# Patient Record
Sex: Male | Born: 1945 | Race: White | Hispanic: No | Marital: Married | Smoking: Never smoker
Health system: Southern US, Community
[De-identification: ages and names within clinical notes are randomized; demographics above are authoritative.]

## PROBLEM LIST (undated history)

## (undated) DIAGNOSIS — M109 Gout, unspecified: Secondary | ICD-10-CM

## (undated) DIAGNOSIS — I10 Essential (primary) hypertension: Secondary | ICD-10-CM

## (undated) DIAGNOSIS — R42 Dizziness and giddiness: Secondary | ICD-10-CM

## (undated) DIAGNOSIS — G4730 Sleep apnea, unspecified: Secondary | ICD-10-CM

## (undated) DIAGNOSIS — N400 Enlarged prostate without lower urinary tract symptoms: Secondary | ICD-10-CM

## (undated) DIAGNOSIS — K219 Gastro-esophageal reflux disease without esophagitis: Secondary | ICD-10-CM

## (undated) HISTORY — DX: Essential (primary) hypertension: I10

## (undated) HISTORY — PX: APPENDECTOMY: SHX54

## (undated) HISTORY — DX: Gout, unspecified: M10.9

## (undated) HISTORY — DX: Benign prostatic hyperplasia without lower urinary tract symptoms: N40.0

---

## 2000-09-03 ENCOUNTER — Encounter: Payer: Self-pay | Admitting: Family Medicine

## 2002-09-04 ENCOUNTER — Encounter: Payer: Self-pay | Admitting: Family Medicine

## 2002-09-04 LAB — CONVERTED CEMR LAB: PSA: 0.2 ng/mL

## 2003-10-04 ENCOUNTER — Encounter: Payer: Self-pay | Admitting: Family Medicine

## 2003-10-04 LAB — CONVERTED CEMR LAB: PSA: 0.4 ng/mL

## 2003-12-29 ENCOUNTER — Ambulatory Visit (HOSPITAL_COMMUNITY): Admission: RE | Admit: 2003-12-29 | Discharge: 2003-12-29 | Payer: Self-pay | Admitting: *Deleted

## 2004-02-17 ENCOUNTER — Ambulatory Visit: Payer: Self-pay | Admitting: Family Medicine

## 2004-04-09 ENCOUNTER — Ambulatory Visit: Payer: Self-pay | Admitting: Family Medicine

## 2005-02-18 ENCOUNTER — Ambulatory Visit: Payer: Self-pay | Admitting: Family Medicine

## 2005-03-04 ENCOUNTER — Ambulatory Visit: Payer: Self-pay | Admitting: Family Medicine

## 2005-04-01 ENCOUNTER — Ambulatory Visit: Payer: Self-pay | Admitting: Family Medicine

## 2005-09-07 ENCOUNTER — Ambulatory Visit: Payer: Self-pay | Admitting: Family Medicine

## 2005-10-15 ENCOUNTER — Ambulatory Visit (HOSPITAL_COMMUNITY): Admission: RE | Admit: 2005-10-15 | Discharge: 2005-10-15 | Payer: Self-pay | Admitting: Specialist

## 2005-12-23 ENCOUNTER — Ambulatory Visit: Payer: Self-pay | Admitting: Family Medicine

## 2005-12-31 ENCOUNTER — Ambulatory Visit: Payer: Self-pay | Admitting: Family Medicine

## 2006-01-07 ENCOUNTER — Ambulatory Visit: Payer: Self-pay | Admitting: Family Medicine

## 2006-01-17 ENCOUNTER — Ambulatory Visit: Payer: Self-pay | Admitting: Family Medicine

## 2006-03-07 ENCOUNTER — Ambulatory Visit: Payer: Self-pay | Admitting: Family Medicine

## 2006-03-07 LAB — CONVERTED CEMR LAB: PSA: 0.41 ng/mL

## 2006-03-10 ENCOUNTER — Ambulatory Visit: Payer: Self-pay | Admitting: Family Medicine

## 2006-03-31 ENCOUNTER — Ambulatory Visit: Payer: Self-pay | Admitting: Family Medicine

## 2006-04-07 ENCOUNTER — Ambulatory Visit: Payer: Self-pay | Admitting: Family Medicine

## 2006-05-09 ENCOUNTER — Encounter (INDEPENDENT_AMBULATORY_CARE_PROVIDER_SITE_OTHER): Payer: Self-pay | Admitting: *Deleted

## 2006-05-09 ENCOUNTER — Ambulatory Visit (HOSPITAL_COMMUNITY): Admission: RE | Admit: 2006-05-09 | Discharge: 2006-05-09 | Payer: Self-pay | Admitting: General Surgery

## 2007-01-02 HISTORY — PX: OTHER SURGICAL HISTORY: SHX169

## 2007-02-10 ENCOUNTER — Ambulatory Visit: Payer: Self-pay | Admitting: Family Medicine

## 2007-03-09 ENCOUNTER — Encounter: Payer: Self-pay | Admitting: Family Medicine

## 2007-03-09 DIAGNOSIS — N400 Enlarged prostate without lower urinary tract symptoms: Secondary | ICD-10-CM | POA: Insufficient documentation

## 2007-03-09 DIAGNOSIS — L309 Dermatitis, unspecified: Secondary | ICD-10-CM | POA: Insufficient documentation

## 2007-03-09 DIAGNOSIS — L259 Unspecified contact dermatitis, unspecified cause: Secondary | ICD-10-CM

## 2007-03-13 ENCOUNTER — Ambulatory Visit: Payer: Self-pay | Admitting: Family Medicine

## 2007-03-13 LAB — CONVERTED CEMR LAB
ALT: 25 units/L (ref 0–53)
AST: 32 units/L (ref 0–37)
Alkaline Phosphatase: 45 units/L (ref 39–117)
CO2: 29 meq/L (ref 19–32)
Calcium: 9.2 mg/dL (ref 8.4–10.5)
Chloride: 102 meq/L (ref 96–112)
Creatinine, Ser: 1.1 mg/dL (ref 0.4–1.5)
Glucose, Bld: 95 mg/dL (ref 70–99)
PSA: 0.37 ng/mL (ref 0.10–4.00)
TSH: 1.33 microintl units/mL (ref 0.35–5.50)
Total CHOL/HDL Ratio: 4.4
VLDL: 16 mg/dL (ref 0–40)

## 2007-03-15 ENCOUNTER — Ambulatory Visit: Payer: Self-pay | Admitting: Family Medicine

## 2007-03-23 ENCOUNTER — Ambulatory Visit: Payer: Self-pay | Admitting: Family Medicine

## 2007-03-24 ENCOUNTER — Ambulatory Visit: Payer: Self-pay | Admitting: Family Medicine

## 2007-03-27 ENCOUNTER — Encounter (INDEPENDENT_AMBULATORY_CARE_PROVIDER_SITE_OTHER): Payer: Self-pay | Admitting: *Deleted

## 2007-05-01 ENCOUNTER — Telehealth: Payer: Self-pay | Admitting: Family Medicine

## 2007-05-04 ENCOUNTER — Ambulatory Visit: Payer: Self-pay | Admitting: Family Medicine

## 2007-05-29 ENCOUNTER — Ambulatory Visit: Payer: Self-pay | Admitting: Family Medicine

## 2007-06-23 ENCOUNTER — Ambulatory Visit: Payer: Self-pay | Admitting: Family Medicine

## 2007-06-29 ENCOUNTER — Encounter: Payer: Self-pay | Admitting: Family Medicine

## 2007-07-13 ENCOUNTER — Encounter: Payer: Self-pay | Admitting: Family Medicine

## 2007-07-18 ENCOUNTER — Telehealth: Payer: Self-pay | Admitting: Family Medicine

## 2007-11-21 ENCOUNTER — Ambulatory Visit (HOSPITAL_COMMUNITY): Admission: RE | Admit: 2007-11-21 | Discharge: 2007-11-21 | Payer: Self-pay | Admitting: Specialist

## 2008-01-03 ENCOUNTER — Ambulatory Visit: Payer: Self-pay | Admitting: Family Medicine

## 2008-03-18 ENCOUNTER — Ambulatory Visit: Payer: Self-pay | Admitting: Family Medicine

## 2008-03-18 LAB — CONVERTED CEMR LAB
ALT: 18 units/L (ref 0–53)
BUN: 15 mg/dL (ref 6–23)
Basophils Absolute: 0 10*3/uL (ref 0.0–0.1)
Basophils Relative: 0.7 % (ref 0.0–3.0)
Bilirubin, Direct: 0.1 mg/dL (ref 0.0–0.3)
Chloride: 107 meq/L (ref 96–112)
Cholesterol: 190 mg/dL (ref 0–200)
Eosinophils Absolute: 0.2 10*3/uL (ref 0.0–0.7)
GFR calc Af Amer: 87 mL/min
GFR calc non Af Amer: 72 mL/min
Glucose, Bld: 100 mg/dL — ABNORMAL HIGH (ref 70–99)
Hemoglobin: 15.3 g/dL (ref 13.0–17.0)
Neutrophils Relative %: 55.4 % (ref 43.0–77.0)
Potassium: 4.4 meq/L (ref 3.5–5.1)
RBC: 4.78 M/uL (ref 4.22–5.81)
RDW: 11.5 % (ref 11.5–14.6)
Sodium: 142 meq/L (ref 135–145)
Total Bilirubin: 0.8 mg/dL (ref 0.3–1.2)
Total Protein: 6.6 g/dL (ref 6.0–8.3)

## 2008-03-21 ENCOUNTER — Ambulatory Visit: Payer: Self-pay | Admitting: Family Medicine

## 2008-03-28 ENCOUNTER — Ambulatory Visit: Payer: Self-pay | Admitting: Family Medicine

## 2008-03-28 ENCOUNTER — Encounter (INDEPENDENT_AMBULATORY_CARE_PROVIDER_SITE_OTHER): Payer: Self-pay | Admitting: *Deleted

## 2008-03-28 LAB — CONVERTED CEMR LAB
OCCULT 1: NEGATIVE
OCCULT 2: NEGATIVE

## 2008-05-29 ENCOUNTER — Ambulatory Visit: Payer: Self-pay | Admitting: Family Medicine

## 2008-08-26 ENCOUNTER — Ambulatory Visit: Payer: Self-pay | Admitting: Family Medicine

## 2009-01-14 ENCOUNTER — Ambulatory Visit: Payer: Self-pay | Admitting: Family Medicine

## 2009-03-06 ENCOUNTER — Ambulatory Visit: Payer: Self-pay | Admitting: Family Medicine

## 2009-03-06 ENCOUNTER — Telehealth: Payer: Self-pay | Admitting: Family Medicine

## 2009-03-20 ENCOUNTER — Ambulatory Visit: Payer: Self-pay | Admitting: Family Medicine

## 2009-03-20 LAB — CONVERTED CEMR LAB
AST: 16 units/L (ref 0–37)
Alkaline Phosphatase: 43 units/L (ref 39–117)
Basophils Absolute: 0 10*3/uL (ref 0.0–0.1)
CO2: 27 meq/L (ref 19–32)
Calcium: 9.5 mg/dL (ref 8.4–10.5)
Chloride: 104 meq/L (ref 96–112)
Creatinine, Ser: 1.15 mg/dL (ref 0.40–1.50)
Glucose, Bld: 86 mg/dL (ref 70–99)
Lymphocytes Relative: 27 % (ref 12–46)
Lymphs Abs: 1.9 10*3/uL (ref 0.7–4.0)
MCHC: 34.5 g/dL (ref 30.0–36.0)
Neutro Abs: 3.7 10*3/uL (ref 1.7–7.7)
Neutrophils Relative %: 54 % (ref 43–77)
Potassium: 4.4 meq/L (ref 3.5–5.3)
Total Bilirubin: 0.5 mg/dL (ref 0.3–1.2)
WBC: 6.9 10*3/uL (ref 4.0–10.5)

## 2009-03-25 ENCOUNTER — Ambulatory Visit: Payer: Self-pay | Admitting: Family Medicine

## 2009-04-02 ENCOUNTER — Ambulatory Visit: Payer: Self-pay | Admitting: Family Medicine

## 2009-04-02 LAB — CONVERTED CEMR LAB
OCCULT 1: NEGATIVE
OCCULT 2: NEGATIVE
OCCULT 3: NEGATIVE

## 2009-04-02 LAB — FECAL OCCULT BLOOD, GUAIAC: Fecal Occult Blood: NEGATIVE

## 2009-04-03 ENCOUNTER — Encounter: Payer: Self-pay | Admitting: Family Medicine

## 2009-05-06 HISTORY — PX: OTHER SURGICAL HISTORY: SHX169

## 2009-07-01 ENCOUNTER — Encounter: Payer: Self-pay | Admitting: Unknown Physician Specialty

## 2009-07-04 ENCOUNTER — Encounter: Payer: Self-pay | Admitting: Unknown Physician Specialty

## 2009-08-03 ENCOUNTER — Encounter: Payer: Self-pay | Admitting: Unknown Physician Specialty

## 2009-11-11 ENCOUNTER — Encounter (INDEPENDENT_AMBULATORY_CARE_PROVIDER_SITE_OTHER): Payer: Self-pay | Admitting: *Deleted

## 2010-01-15 ENCOUNTER — Ambulatory Visit: Payer: Self-pay | Admitting: Family Medicine

## 2010-02-06 ENCOUNTER — Telehealth: Payer: Self-pay | Admitting: Family Medicine

## 2010-03-23 ENCOUNTER — Ambulatory Visit: Payer: Self-pay | Admitting: Family Medicine

## 2010-03-24 LAB — CONVERTED CEMR LAB
ALT: 19 units/L (ref 0–53)
Alkaline Phosphatase: 52 units/L (ref 39–117)
Basophils Absolute: 0 10*3/uL (ref 0.0–0.1)
Bilirubin, Direct: 0.1 mg/dL (ref 0.0–0.3)
CO2: 29 meq/L (ref 19–32)
Calcium: 9.3 mg/dL (ref 8.4–10.5)
Chloride: 106 meq/L (ref 96–112)
Creatinine, Ser: 1.1 mg/dL (ref 0.4–1.5)
Direct LDL: 142.6 mg/dL
HCT: 43.2 % (ref 39.0–52.0)
HDL: 42.2 mg/dL (ref >39.00)
Hemoglobin: 14.7 g/dL (ref 13.0–17.0)
Lymphocytes Relative: 19.2 % (ref 12.0–46.0)
MCV: 93.3 fL (ref 78.0–100.0)
Monocytes Absolute: 0.9 10*3/uL (ref 0.1–1.0)
Neutrophils Relative %: 66 % (ref 43.0–77.0)
PSA: 0.4 ng/mL (ref 0.10–4.00)
Potassium: 4.2 meq/L (ref 3.5–5.1)
RDW: 12.6 % (ref 11.5–14.6)
Total Bilirubin: 1.1 mg/dL (ref 0.3–1.2)
Total Protein: 6.5 g/dL (ref 6.0–8.3)
VLDL: 15 mg/dL (ref 0.0–40.0)

## 2010-03-26 ENCOUNTER — Ambulatory Visit: Payer: Self-pay | Admitting: Family Medicine

## 2010-03-26 DIAGNOSIS — R42 Dizziness and giddiness: Secondary | ICD-10-CM | POA: Insufficient documentation

## 2010-05-06 NOTE — Letter (Signed)
Summary: Nadara Eaton letter  [place] [facility]  [address] [place], [place] [address]   Phone: [phone]  Fax: [phone]       11/11/2009 MRN: [id number]  [facility] [address] [place], [place]  [address]  Dear Mr. [facility] [place], and [facility] announce the retirement of Arta Silence, M.D., from full-time practice at the [facility] office effective October 02, 2009 and his plans of returning part-time.  It is important to Dr. Hetty Ely and to our practice that you understand that [facility] has seven physicians in our office for your health care needs.  We will continue to offer the same exceptional care that you have today.    Dr. Hetty Ely has spoken to many of you about his plans for retirement and returning part-time in the fall.   We will continue to work with you through the transition to schedule appointments for you in the office and meet the high standards that [place] is committed to.   Again, it is with great pleasure that we share the news that Dr. Hetty Ely will return to [facility] in October of 2011 with a reduced schedule.    If you have any questions, or would like to request an appointment with one of our physicians, please call us at [phone] and press the option for Scheduling an appointment.  We take pleasure in providing you with excellent patient care and look forward to seeing you at your next office visit.  Our [facility] Physicians are:  Tillman Abide, M.D. Laurita Quint, M.D. Roxy Manns, M.D. Kerby Nora, M.D. Hannah Beat, M.D. Ruthe Mannan, M.D. We proudly welcomed Raechel Ache, M.D. and Eustaquio Boyden, M.D. to the practice in July/August 2011.  Sincerely,  [facility]

## 2010-05-06 NOTE — Progress Notes (Signed)
Summary: Halog Cream  Phone Note Refill Request Message from:  Scriptline on February 06, 2010 8:15 AM  Refills Requested: Medication #1:  HALOG 0.1 %  CREA Apply As needed. [facility]  [id number]   Last Fill Date:  No date sent   Pharmacy Phone:  [phone]   Method Requested: Electronic Initial call taken by: Delilah Shan CMA Duncan Dull),  February 06, 2010 8:15 AM  Follow-up for Phone Call        please send in 60g with 1 rf.  Follow-up by: Crawford Givens MD,  February 06, 2010 8:23 AM  Additional Follow-up for Phone Call Additional follow up Details #1::        Done Additional Follow-up by: Delilah Shan CMA (AAMA),  February 06, 2010 8:27 AM    New/Updated Medications: HALOG 0.1 %  CREA (HALCINONIDE) Apply As needed two times a day, notify MD lack of response. Prescriptions: HALOG 0.1 %  CREA (HALCINONIDE) Apply As needed two times a day, notify MD lack of response.  #60g x 1   Entered by:   Delilah Shan CMA (AAMA)   Authorized by:   Crawford Givens MD   Signed by:   Delilah Shan CMA (AAMA) on 02/06/2010   Method used:   [facility]ronically to        [facility]  [id number] (retail[facility]       [address]       [place], [place]  [address]       Ph: [phone] or [phone]       Fax: [phone]   RxID:   [id number] HALOG 0.1 %  CREA (HALCINONIDE) Apply As needed two times a day, notify MD lack of response.  #60g x 1   Entered and Authorized by:   Crawford Givens MD   Signed by:   Crawford Givens MD on 02/06/2010   Method used:   Historical   RxID:   [id number]

## 2010-05-06 NOTE — Assessment & Plan Note (Signed)
Summary: FLU SHOT/CLE  Nurse Visit   Allergies: No Known Drug Allergies  Immunizations Administered:  Influenza Vaccine # 1:    Vaccine Type: Fluvax 3+    Site: left deltoid    Mfr: GlaxoSmithKline    Dose: 0.5 ml    Route: IM    Given by: Mervin Hack CMA (AAMA)    Exp. Date: 10/03/2010    Lot #: [id number]    VIS given: 10/28/09 version given January 15, 2010.  Flu Vaccine Consent Questions:    Do you have a history of severe allergic reactions to this vaccine? no    Any prior history of allergic reactions to egg and/or gelatin? no    Do you have a sensitivity to the preservative Thimersol? no    Do you have a past history of Guillan-Barre Syndrome? no    Do you currently have an acute febrile illness? no    Have you ever had a severe reaction to latex? no    Vaccine information given and explained to patient? yes  Orders Added: 1)  Flu Vaccine 48yrs + [90658] 2)  Admin 1st Vaccine [09811]

## 2010-05-07 NOTE — Assessment & Plan Note (Signed)
Summary: CPX/CLE   Vital Signs:  Patient profile:   65 year old male Weight:      216.50 pounds BMI:     30.30 Temp:     98.2 degrees F oral Pulse rate:   64 / minute Pulse rhythm:   regular BP sitting:   128 / 84  (left arm) Cuff size:   large  Vitals Entered By: Sydell Axon LPN (01-Apr-2010 10:49 AM) CC: 30 Minute checkup   History of Present Illness: Pt here for Comp Exam. He hasn't been here in one year and feels well excepyt for vertigo which appears affected by pressure changes in the atmosphere and eating seems to help his dizziness also. He has seen ENT and was told he had some hearing loss in the right ear. Ruled out  He saw PT and that helped a lot.Marland KitchenMarland KitchenMarland KitchenFeb/Mar time frame. He continues. His sister also has vertigo of BPPD but comes and goes.   Preventive Screening-Counseling & Management  Alcohol-Tobacco     Alcohol drinks/day: 0     Smoking Status: never     Passive Smoke Exposure: no  Caffeine-Diet-Exercise     Caffeine use/day: 4     Does Patient Exercise: yes     Type of exercise: gym, dances 3-5 times a week.     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  (ICD-692.9) 9)  Benign Prostatic Hypertrophy  (ICD-600.00)  Medications Prior to Update: 1)  Halog 0.1 %  Crea (Halcinonide) .... Apply As Needed Two Times A Day, Notify Md Lack of Response.  Current Medications (verified): 1)  Halog 0.1 %  Crea (Halcinonide) .... Apply As Needed Two Times A Day, Notify Md Lack of Response.  Allergies: No Known Drug Allergies  Past History:  Past Medical History: Last updated: 03/09/2007 Benign prostatic hypertrophy  Family  History: Last updated: 2010-04-01 Father: Died at age 9 Sep 13, 2022), Prostate cancer (99), lung cancer, pneumonia Mother: Died at age 20, CHF, died during Cath secondary MI, overwhelming hypothyroidism Brother A 9 Sister A  66  HTN  BPPV CV + Mother MI HBP ? 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 + Mother DM - none Prostate cancer + Father Depression  - none ETOH/Drug Abuse - none Stroke + MGF  Review of Systems General:  Denies chills, fatigue, fever, sweats, weakness, and weight  loss. Eyes:  Denies blurring, discharge, and eye pain. ENT:  Complains of decreased hearing; denies ear discharge and ringing in ears; right. CV:  Denies chest pain or discomfort, fainting, fatigue, palpitations, shortness of breath with exertion, swelling of feet, and swelling of hands. Resp:  Denies cough, shortness of breath, and wheezing. GI:  Complains of indigestion; denies abdominal pain, bloody stools, change in bowel habits, constipation, dark tarry stools, diarrhea, loss of appetite, nausea, vomiting, vomiting blood, and yellowish skin color; mild. GU:  Denies discharge,  nocturia, and urinary frequency. MS:  Complains of joint pain and stiffness; denies low back pain, muscle aches, and muscle weakness; knees and shoulder. Derm:  Complains of dryness, itching, and rash; has known eczema.. Neuro:  Denies numbness, poor balance, tingling, and tremors.  Physical Exam  General:  Well-developed,well-nourished,in no acute distress; alert,appropriate and cooperative throughout examination Head:  Normocephalic and atraumatic without obvious abnormalities. No apparent alopecia or balding. Sinuses NT. Eyes:  Conjunctiva clear bilaterally. No nystagmus noted. Ears:  External ear exam shows no significant lesions or deformities.  Otoscopic examination reveals clear canals, tympanic membranes are intact bilaterally without bulging, retraction, inflammation or discharge. Hearing is grossly normal bilaterally. Nose:  External nasal examination shows no deformity or inflammation. Nasal mucosa are pink and moist without lesions or exudates. Mouth:  Oral mucosa and oropharynx without lesions or exudates.  Teeth in good repair. Neck:  supple with full rom and no masses or thyromegally, no JVD or carotid bruit  Chest Wall:  No deformities, masses, tenderness or gynecomastia noted. Breasts:  No masses or gynecomastia noted Lungs:  Normal respiratory effort, chest expands symmetrically. Lungs are clear to auscultation, no crackles or wheezes. Heart:  Normal rate and regular rhythm. S1 and S2 normal without gallop, murmur, click, rub or other extra sounds. Abdomen:  Bowel sounds positive,abdomen soft and non-tender without masses, organomegaly or hernias noted. Rectal:  No external abnormalities noted. Normal sphincter tone. No rectal masses or tenderness. G neg, mild scar of fistula repair int eh canlo at the 10 oclock posistion and decompr hemms. Genitalia:  Testes bilaterally descended without nodularity, tenderness or masses. No scrotal masses or lesions. No penis lesions or  urethral discharge. Prostate:  Prostate gland firm and smooth, no enlargement, nodularity, tenderness, mass, asymmetry or induration. 20-30-gms. Msk:  No deformity or scoliosis noted of thoracic or lumbar spine.   Pulses:  R and L carotid,radial,femoral,dorsalis pedis and posterior tibial pulses are full and equal bilaterally Extremities:  No clubbing, cyanosis, edema, or deformity noted with normal full range of motion of all joints.   Neurologic:  No cranial nerve deficits noted. Station and gait are normal. Sensory, motor and coordinative functions appear intact. Movement quickly causes mild dysequilibriun but not spinning. Skin:  Intact without suspicious lesions or rashes Cervical Nodes:  No lymphadenopathy noted Inguinal Nodes:  No significant adenopathy Psych:  Cognition and judgment appear intact. Alert and cooperative with normal attention span and concentration. No apparent delusions, illusions, hallucinations   Impression & Recommendations:  Problem # 1:  HEALTH MAINTENANCE EXAM (ICD-V70.0) Assessment Comment Only  Reviewed preventive care protocols, scheduled due services, and updated immunizations.  Problem # 2:  ECZEMA (ICD-692.9) Assessment: Unchanged  Stable. His updated medication list for this problem includes:    Halog 0.1 % Crea (Halcinonide) .Marland Kitchen... Apply as needed two times a day, notify md lack of response.  Discussed avoidance of triggers and symptomatic treatment.   Problem # 3:  HYPERCHOLESTEROLEMIA, 177/LDL 117/HDL 35.0 (ICD-272.0)  Assessment: Unchanged LDL slightly high...discussed meds which he wants to avoid and diet. Labs Reviewed: SGOT: 27 (03/23/2010)   SGPT: 19 (03/23/2010)   HDL:42.20 (03/23/2010), 53 (03/20/2009)  LDL:120 (03/20/2009), 132 (03/18/2008)  Chol:205 (03/23/2010), 191 (03/20/2009)  Trig:75.0 (03/23/2010), 92 (03/20/2009)  Problem # 4:  BENIGN PROSTATIC HYPERTROPHY (ICD-600.00) Assessment: Unchanged Stable.  Problem # 5:  DIZZINESS,  CHRONIC (ICD-780.4) Assessment: Unchanged  Hads learned to live with this. Has seen ENT, has had PT and has done extra exercises on his own. He is very active, competitively dances and works out regularly. Seems controlled.  Complete Medication List: 1)  Halog 0.1 % Crea (Halcinonide) .... Apply as needed two times a day, notify md lack of response.  Patient Instructions: 1)  RTC one year, sooner as needed.   Orders Added: 1)  Est. Patient 40-64 years [99396]    Current Allergies (reviewed today): No known allergies

## 2010-08-12 ENCOUNTER — Telehealth: Payer: Self-pay | Admitting: *Deleted

## 2010-08-12 NOTE — Telephone Encounter (Signed)
I would suggest a pneumonia vaccine the next time he is in here. His reminding me would be appreciated. Stool card testing should be done yearly. My mistake but honestly believe he can wait until next visit.

## 2010-08-12 NOTE — Telephone Encounter (Signed)
Pt received a reminder letter from [facility] that he has not had a pneumonia vaccine and no recent colon cancer check.  He says he didn't get stool cards at his visit last December.  Please advise if these are things that he needs.  He says if these arent necessary he doesn't want them.

## 2010-08-13 NOTE — Telephone Encounter (Signed)
Patient notified as instructed by telephone. 

## 2010-08-21 NOTE — Op Note (Signed)
Alan Castillo, Alan Castillo              ACCOUNT NO.:  [id number]   MEDICAL RECORD NO.:  [id number]          PATIENT TYPE:  AMB   LOCATION:  DAY                          FACILITY:  [facility]   PHYSICIAN:  Timothy E. Earlene Plater, M.D. DATE OF BIRTH:  1945-04-25   DATE OF PROCEDURE:  05/09/2006  DATE OF DISCHARGE:                               OPERATIVE REPORT   PREOPERATIVE DIAGNOSIS:  Fistula in ano.   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   OPERATIVE PROCEDURE:  Examination under anesthesia.  Polypectomy,  flexible sigmoidoscopy and fistulotomy with application of seton.   SURGEON:  Timothy E. Earlene Plater, M.D.   ANESTHESIA:  General.   Mr. Molinelli is 7, otherwise healthy, was in the office in the last 6  weeks, had an incision and drainage of rectal abscess.  That has settled  down.  He has been on antibiotics and he presented couple of weeks ago  with a defined fistula in ano.  This was explained to him in as much  detail was possible including the particulars of the sphincter muscles.  He is now prepared for this procedure.  He has a history of rectal  bleeding, rectal pain.   The patient is seen, identified the permit signed.   The patient taken to the operating room, placed supine.  LMA anesthesia  provided.  He was placed in lithotomy.  The right posterior fistula in  ano was noted externally.  By digital exam, there was stricture and the  fistula could be palpated.  I passed the flexible sigmoidoscopy beyond  this for a distance of 50 cm.  There was some solid and liquid stool but  as best could be seen and irrigated, there was no disease process.  Scope withdrawn and removed.  Then using the operating anoscope after  the anus area was prepared and draped in the usual fashion.  A malleable  fistula probe was passed from the external opening to the internal  opening.  The external opening was exactly 2 cm to the right of the  posterior midline,  10 cm from the anal orifice.  The internal opening  was 3 cm inside the anal verge directly posterior.  There was a rectal  polyp that was removed.  There was a bridging redundant rectal mucosa  over the fistula that was removed and then the internal fistulous  opening was debrided and excised.  I removed a malleable probe and then  using bone curettes gently debrided the entire fistula tract.  I also  passed heavy silk suture and further debrided the tract.  I then passed  a vein loop through the fistulous tract brought it to the outside and  tied it snugly externally.  I partially closed the internal opening by  applying 2-0 Vicryl sutures.  When this procedure was  complete.  Gelfoam gauze and dry sterile dressing applied.  He tolerated  it well.  Counts correct and he was removed to recovery in good  condition.   Written and verbal instructions given him and his wife including  Percocet #36 and  Cipro 500 b.i.d.      Timothy E. Earlene Plater, M.D.  Electronically Signed     TED/MEDQ  D:  05/09/2006  T:  05/09/2006  Job:  [id number]

## 2010-08-21 NOTE — Letter (Signed)
January 19, 2006     Gita Kudo, M.D.  [address], [address]  [place] [address]   RE:  OPIE, MACLAUGHLIN  MRN:  [id number]  /  DOB:  [birth year]   Dear Dr. Maryagnes Amos:   This is to introduce Alan Castillo, a 65 year old white male who has an  abscess on his gluteal area.  I saw him initially September 20 with an area  of what I thought was an abscess versus a bruise, but thought it was more  likely an abscess and started him Keflex 500 four times a day.  I saw him  back on the 28th prior to my leaving on vacation and he was improving.  It  was felt to be a little bit smaller.  No obvious pointing or fluctuance.  I  extended his Keflex until I was going to be back from vacation to see him  then.  He cam in the following week to see one of my partners with worsening  symptoms.  She suspected possible MRSA, put him on Bactrim DS two b.i.d.,  but did not send off a culture.  I saw him on the 15th and things looked to  be improved from his perspective.  However, he has three sites of  spontaneous eruption and is draining purulent material which I sent for  culture, and I just have to wonder - he feels better but I am not sure  whether he may need I&D, and with the extent of what he has at this stage I  thought he ought to be seen by a surgeon.   His past medical history otherwise is significant for elevated cholesterol,  BPH, eczema, and atopic dermatitis.  He is a real healthy guy who is on no  medicines other than his antibiotic at the present time.  He has no known  drug allergies and has no other real problems.   I appreciate your seeing him and look forward to your evaluation.    Sincerely,     ______________________________  Arta Silence, MD    RNS/MedQ  /  Job #:  [id number]  DD:  01/19/2006 / DT:  01/20/2006

## 2011-01-21 ENCOUNTER — Ambulatory Visit (INDEPENDENT_AMBULATORY_CARE_PROVIDER_SITE_OTHER): Payer: BC Managed Care – PPO

## 2011-01-21 DIAGNOSIS — Z23 Encounter for immunization: Secondary | ICD-10-CM

## 2011-03-22 ENCOUNTER — Other Ambulatory Visit: Payer: Self-pay | Admitting: Family Medicine

## 2011-03-22 DIAGNOSIS — R42 Dizziness and giddiness: Secondary | ICD-10-CM

## 2011-03-22 DIAGNOSIS — N400 Enlarged prostate without lower urinary tract symptoms: Secondary | ICD-10-CM

## 2011-03-22 DIAGNOSIS — E7800 Pure hypercholesterolemia, unspecified: Secondary | ICD-10-CM | POA: Insufficient documentation

## 2011-03-24 ENCOUNTER — Encounter: Payer: Self-pay | Admitting: Family Medicine

## 2011-03-24 ENCOUNTER — Other Ambulatory Visit (INDEPENDENT_AMBULATORY_CARE_PROVIDER_SITE_OTHER): Payer: MEDICARE

## 2011-03-24 DIAGNOSIS — R42 Dizziness and giddiness: Secondary | ICD-10-CM

## 2011-03-24 DIAGNOSIS — E7800 Pure hypercholesterolemia, unspecified: Secondary | ICD-10-CM

## 2011-03-24 LAB — LIPID PANEL
Cholesterol: 182 mg/dL (ref 0–200)
LDL Cholesterol: 117 mg/dL — ABNORMAL HIGH (ref 0–99)
VLDL: 18.2 mg/dL (ref 0.0–40.0)

## 2011-03-24 LAB — HEPATIC FUNCTION PANEL
AST: 26 U/L (ref 0–37)
Albumin: 4.3 g/dL (ref 3.5–5.2)
Alkaline Phosphatase: 56 U/L (ref 39–117)

## 2011-03-24 LAB — CBC WITH DIFFERENTIAL/PLATELET
Basophils Relative: 0.7 % (ref 0.0–3.0)
Lymphocytes Relative: 29.8 % (ref 12.0–46.0)
MCHC: 33.7 g/dL (ref 30.0–36.0)
Neutro Abs: 3.2 10*3/uL (ref 1.4–7.7)
Neutrophils Relative %: 53.7 % (ref 43.0–77.0)
Platelets: 228 10*3/uL (ref 150.0–400.0)
RBC: 4.99 Mil/uL (ref 4.22–5.81)
RDW: 12.2 % (ref 11.5–14.6)
WBC: 5.9 10*3/uL (ref 4.5–10.5)

## 2011-03-24 LAB — RENAL FUNCTION PANEL
Albumin: 4.3 g/dL (ref 3.5–5.2)
BUN: 15 mg/dL (ref 6–23)
CO2: 29 mEq/L (ref 19–32)
Creatinine, Ser: 1.1 mg/dL (ref 0.4–1.5)
GFR: 69.82 mL/min (ref >60.00)
Glucose, Bld: 94 mg/dL (ref 70–99)

## 2011-03-24 LAB — TSH: TSH: 1.59 u[IU]/mL (ref 0.35–5.50)

## 2011-03-25 ENCOUNTER — Other Ambulatory Visit: Payer: Self-pay

## 2011-04-01 ENCOUNTER — Ambulatory Visit (INDEPENDENT_AMBULATORY_CARE_PROVIDER_SITE_OTHER): Payer: Medicare Other | Admitting: Family Medicine

## 2011-04-01 ENCOUNTER — Encounter: Payer: Self-pay | Admitting: Family Medicine

## 2011-04-01 DIAGNOSIS — M25569 Pain in unspecified knee: Secondary | ICD-10-CM

## 2011-04-01 DIAGNOSIS — L259 Unspecified contact dermatitis, unspecified cause: Secondary | ICD-10-CM

## 2011-04-01 DIAGNOSIS — M25561 Pain in right knee: Secondary | ICD-10-CM | POA: Insufficient documentation

## 2011-04-01 DIAGNOSIS — E7800 Pure hypercholesterolemia, unspecified: Secondary | ICD-10-CM

## 2011-04-01 DIAGNOSIS — N400 Enlarged prostate without lower urinary tract symptoms: Secondary | ICD-10-CM

## 2011-04-01 DIAGNOSIS — R42 Dizziness and giddiness: Secondary | ICD-10-CM

## 2011-04-01 DIAGNOSIS — Z23 Encounter for immunization: Secondary | ICD-10-CM

## 2011-04-01 MED ORDER — HYDROCORTISONE VALERATE 0.2 % EX CREA: TOPICAL_CREAM | Freq: Two times a day (BID) | CUTANEOUS | Status: AC

## 2011-04-01 NOTE — Progress Notes (Signed)
Addended by: Sydell Axon C on: 04/01/2011 11:00 AM   Modules accepted: Orders

## 2011-04-01 NOTE — Assessment & Plan Note (Signed)
Tolerates well. His ballroom dancing actually improves this.

## 2011-04-01 NOTE — Progress Notes (Signed)
  Subjective:    Patient ID: Alan Castillo, male    DOB: 02/22/1946, 65 y.o.   MRN: [id number]  HPI Pt here for Comp Exam. He has his own insurance as he is still working full time and does not yet use Medicare. His rectum has finally seemed to heal. He has vertigo regularly and some mild hearing loss, nonprogressive.  He and his wife won the [employer] in [place] in 58 and older!! He knee continues to bother him!!    Review of Systems  Constitutional:       He had a cold for about a month.   HENT: Positive for hearing loss (mild).   Musculoskeletal: Positive for arthralgias (knees.).  Skin:       Chronic eczema.  Neurological: Positive for dizziness (chronic vertigo, tolerates. ).       Objective:   Physical Exam  Constitutional: He is oriented to person, place, and time. He appears well-developed and well-nourished. No distress.  HENT:  Head: Normocephalic and atraumatic.  Right Ear: External ear normal.  Left Ear: External ear normal.  Nose: Nose normal.  Mouth/Throat: Oropharynx is clear and moist.  Eyes: Conjunctivae and EOM are normal. Pupils are equal, round, and reactive to light. Right eye exhibits no discharge. Left eye exhibits no discharge. No scleral icterus.  Neck: Normal range of motion. Neck supple. No thyromegaly present.  Cardiovascular: Normal rate, regular rhythm, normal heart sounds and intact distal pulses.   No murmur heard. Pulmonary/Chest: Effort normal and breath sounds normal. No respiratory distress. He has no wheezes.  Abdominal: Soft. Bowel sounds are normal. He exhibits no distension and no mass. There is no tenderness. There is no rebound and no guarding.  Genitourinary: Penis normal.       No rectal done. Has easily irritated canal from past focal infections causing scarring.  Musculoskeletal: Normal range of motion. He exhibits no edema.  Lymphadenopathy:    He has no cervical adenopathy.  Neurological: He is alert and  oriented to person, place, and time. Coordination normal.  Skin: Skin is warm and dry. No rash noted. He is not diaphoretic.  Psychiatric: He has a normal mood and affect. His behavior is normal. Judgment and thought content normal.          Assessment & Plan:  HMPE

## 2011-04-01 NOTE — Assessment & Plan Note (Signed)
PSA has been normal in the past so did not repeat today.

## 2011-04-01 NOTE — Patient Instructions (Addendum)
Pneumovax today. Should only need one as he is now 49. Check with pharmacy about shingles shot but wait one month from today to get it.Marland Kitchen

## 2011-04-01 NOTE — Progress Notes (Addendum)
  Subjective:    Patient ID: Alan Castillo, male    DOB: 1945-04-24, 65 y.o.   MRN: [id number]  HPI    Review of Systems  Constitutional: Negative for fever, chills, diaphoresis, appetite change, fatigue and unexpected weight change.  HENT: Negative for hearing loss, ear pain, tinnitus and ear discharge.   Eyes: Negative for pain, discharge and visual disturbance.  Respiratory: Negative for cough, shortness of breath and wheezing.   Cardiovascular: Negative for chest pain and palpitations.       No SOB w/ exertion  Gastrointestinal: Negative for nausea, vomiting, abdominal pain, diarrhea, constipation and blood in stool.       No heartburn or swallowing problems.  Genitourinary: Negative for dysuria, frequency and difficulty urinating.       No nocturia  Musculoskeletal: Negative for myalgias, back pain and arthralgias.  Skin: Negative for rash.       No itching or dryness.  Neurological: Negative for tremors and numbness.       No tingling or balance problems.  Hematological: Negative for adenopathy. Does not bruise/bleed easily.  Psychiatric/Behavioral: Negative for dysphoric mood and agitation.       Objective:   Physical Exam        Assessment & Plan:

## 2011-04-01 NOTE — Assessment & Plan Note (Signed)
Mostly occupational hazard of the amount of dancing they do. Takes very infrequent Tyl.

## 2011-04-01 NOTE — Assessment & Plan Note (Signed)
Hydrocortisone cream prescribed today. Has had this for a long time and controls it well.

## 2011-04-01 NOTE — Assessment & Plan Note (Signed)
Decent control with reasonably good nos. Cont reg exercise and current diet. Lab Results  Component Value Date   CHOL 182 03/24/2011   HDL 46.40 03/24/2011   LDLCALC 117* 03/24/2011   LDLDIRECT 142.6 03/23/2010   TRIG 91.0 03/24/2011   CHOLHDL 4 03/24/2011

## 2011-04-06 HISTORY — PX: SHOULDER SURGERY: SHX246

## 2011-04-08 ENCOUNTER — Other Ambulatory Visit: Payer: BC Managed Care – PPO

## 2011-04-08 ENCOUNTER — Other Ambulatory Visit: Payer: Self-pay | Admitting: Family Medicine

## 2011-04-08 DIAGNOSIS — Z0000 Encounter for general adult medical examination without abnormal findings: Secondary | ICD-10-CM

## 2011-04-30 ENCOUNTER — Emergency Department: Payer: Self-pay | Admitting: Emergency Medicine

## 2011-08-09 ENCOUNTER — Telehealth: Payer: Self-pay

## 2011-08-09 NOTE — Telephone Encounter (Signed)
OV this week, elevated legs as tolerated in meantime and limit salt.  If CP, SOB then to ER.  Thanks.

## 2011-08-09 NOTE — Telephone Encounter (Signed)
Pt had shoulder tear repair on 08/05/11 by [facility].08/07/11 both feet and ankles began to swell. No pain, no difficulty breathing, no redness or streaks at ankles or feet. Pt has good urine flow and output. [facility] advised swelling could be post operative but suggested pt contact PCP. Pt is keeping feet up while in recliner and swelling goes down slightly after pt rest in bed at night. Pt has been taking ASA 325 mg since surgery on 08/05/11. Pts wife,Mary can be reached at [phone] or [phone]. Pt uses [facility] if pharmacy needed.

## 2011-08-09 NOTE — Telephone Encounter (Signed)
Patient notified as instructed by telephone. Was informed by patient that he already has an appointment with his surgeon (Dr. Thomasena Edis) Wednesday morning. Patient states that he will wait and see what he has to say and will call back and schedule an appointment later in the week if Dr. Thomasena Edis feels that he should. Patient states that if he has CP or SOB he will go straight to the ER.

## 2011-08-09 NOTE — Telephone Encounter (Signed)
Noted  

## 2012-03-27 ENCOUNTER — Other Ambulatory Visit: Payer: Self-pay | Admitting: Family Medicine

## 2012-03-27 DIAGNOSIS — E7800 Pure hypercholesterolemia, unspecified: Secondary | ICD-10-CM

## 2012-03-27 DIAGNOSIS — Z125 Encounter for screening for malignant neoplasm of prostate: Secondary | ICD-10-CM

## 2012-03-31 ENCOUNTER — Other Ambulatory Visit (INDEPENDENT_AMBULATORY_CARE_PROVIDER_SITE_OTHER): Payer: Medicare Other

## 2012-03-31 DIAGNOSIS — Z125 Encounter for screening for malignant neoplasm of prostate: Secondary | ICD-10-CM

## 2012-03-31 DIAGNOSIS — E7800 Pure hypercholesterolemia, unspecified: Secondary | ICD-10-CM

## 2012-03-31 LAB — LIPID PANEL
Cholesterol: 167 mg/dL (ref 0–200)
HDL: 38.1 mg/dL — ABNORMAL LOW (ref >39.00)
Total CHOL/HDL Ratio: 4
Triglycerides: 88 mg/dL (ref 0.0–149.0)

## 2012-03-31 LAB — COMPREHENSIVE METABOLIC PANEL
ALT: 17 U/L (ref 0–53)
Albumin: 4.2 g/dL (ref 3.5–5.2)
BUN: 18 mg/dL (ref 6–23)
Creatinine, Ser: 1.1 mg/dL (ref 0.4–1.5)
Total Protein: 7.1 g/dL (ref 6.0–8.3)

## 2012-04-06 ENCOUNTER — Encounter: Payer: Self-pay | Admitting: Family Medicine

## 2012-04-06 ENCOUNTER — Ambulatory Visit (INDEPENDENT_AMBULATORY_CARE_PROVIDER_SITE_OTHER): Payer: Medicare Other | Admitting: Family Medicine

## 2012-04-06 VITALS — BP 140/88 | HR 76 | Temp 97.5°F | Ht 71.0 in | Wt 218.0 lb

## 2012-04-06 DIAGNOSIS — Z23 Encounter for immunization: Secondary | ICD-10-CM

## 2012-04-06 DIAGNOSIS — R42 Dizziness and giddiness: Secondary | ICD-10-CM

## 2012-04-06 DIAGNOSIS — Z0000 Encounter for general adult medical examination without abnormal findings: Secondary | ICD-10-CM

## 2012-04-06 DIAGNOSIS — Z1211 Encounter for screening for malignant neoplasm of colon: Secondary | ICD-10-CM

## 2012-04-06 NOTE — Patient Instructions (Addendum)
Check with your insurance to see if they will cover the shingles shot. Go to the lab on the way out.  We'll contact you with your lab report. Take care. Keep exercising.  Recheck labs in 1 year before a physical.

## 2012-04-06 NOTE — Progress Notes (Signed)
I have personally reviewed the Medicare Annual Wellness questionnaire and have noted 1. The patient's medical and social history 2. Their use of alcohol, tobacco or illicit drugs 3. Their current medications and supplements 4. The patient's functional ability including ADL's, fall risks, home safety risks and hearing or visual             impairment. 5. Diet and physical activities 6. Evidence for depression or mood disorders  The patients weight, height, BMI have been recorded in the chart and visual acuity is per eye clinic.  I have made referrals, counseling and provided education to the patient based review of the above and I have provided the pt with a written personalized care plan for preventive services.  See scanned forms.  Routine anticipatory guidance given to patient.  See health maintenance. Flu today Shingles d/w pt PNA 2012 Tetanus 2013 D/w patient [id number] for colon cancer screening, including IFOB vs. colonoscopy.  Risks and benefits of both were discussed and patient voiced understanding.  Pt elects JXB:JYNW.  Prostate cancer screening d/w pt.  PSA wnl.  Advance directive d/w pt.  Wife would be designated if he is incapacitated.   Cognitive function addressed- see scanned forms- and if abnormal then additional documentation follows.  Sugar mildly elevated.  D/w pt about diet and continued exercise.    Vertigo sx are at baseline and unchanged.  He is compensating with ballroom dancing and this helps him.    PMH and SH reviewed  Meds, vitals, and allergies reviewed.   ROS: See HPI.  Otherwise negative.    GEN: nad, alert and oriented HEENT: mucous membranes moist NECK: supple w/o LA CV: rrr. PULM: ctab, no inc wob ABD: soft, +bs EXT: no edema SKIN: no acute rash

## 2012-04-07 DIAGNOSIS — Z0000 Encounter for general adult medical examination without abnormal findings: Secondary | ICD-10-CM | POA: Insufficient documentation

## 2012-04-07 NOTE — Assessment & Plan Note (Signed)
Controlled, he does well with exercising/dancing.

## 2012-04-07 NOTE — Assessment & Plan Note (Signed)
See scanned forms.  Routine anticipatory guidance given to patient.  See health maintenance. Flu today Shingles d/w pt PNA 2012 Tetanus 2013 D/w patient [id number] for colon cancer screening, including IFOB vs. colonoscopy.  Risks and benefits of both were discussed and patient voiced understanding.  Pt elects NWG:NFAO.  Prostate cancer screening d/w pt.  PSA wnl.  Advance directive d/w pt.  Wife would be designated if he is incapacitated.   Cognitive function addressed- see scanned forms- and if abnormal then additional documentation follows.  Sugar mildly elevated.  D/w pt about diet and continued exercise.

## 2012-04-17 ENCOUNTER — Other Ambulatory Visit (INDEPENDENT_AMBULATORY_CARE_PROVIDER_SITE_OTHER): Payer: Medicare Other

## 2012-04-17 ENCOUNTER — Encounter: Payer: Self-pay | Admitting: *Deleted

## 2012-04-17 DIAGNOSIS — Z1211 Encounter for screening for malignant neoplasm of colon: Secondary | ICD-10-CM

## 2012-04-17 LAB — FECAL OCCULT BLOOD, IMMUNOCHEMICAL: Fecal Occult Bld: NEGATIVE

## 2012-09-11 ENCOUNTER — Ambulatory Visit (INDEPENDENT_AMBULATORY_CARE_PROVIDER_SITE_OTHER): Payer: Medicare Other | Admitting: Family Medicine

## 2012-09-11 ENCOUNTER — Encounter: Payer: Self-pay | Admitting: Family Medicine

## 2012-09-11 ENCOUNTER — Telehealth: Payer: Self-pay

## 2012-09-11 VITALS — BP 124/80 | HR 66 | Temp 98.1°F | Wt 222.5 lb

## 2012-09-11 DIAGNOSIS — R059 Cough, unspecified: Secondary | ICD-10-CM | POA: Insufficient documentation

## 2012-09-11 DIAGNOSIS — R05 Cough: Secondary | ICD-10-CM

## 2012-09-11 NOTE — Assessment & Plan Note (Signed)
Dry, likely postinfectious and nontoxic.  Reassured.  F/u prn.  He agrees.  He declined meds, ie SABA trial.

## 2012-09-11 NOTE — Telephone Encounter (Signed)
Pt has persistent non productive cough for over one month;no fever,CP or SOB. Pt thinks need to be seen. appt scheduled today at 3 pm.

## 2012-09-11 NOTE — Patient Instructions (Addendum)
This should slowly/gradually improve.  Take care.  Glad to see you.

## 2012-09-11 NOTE — Telephone Encounter (Signed)
will see today. 

## 2012-09-11 NOTE — Progress Notes (Signed)
Cough.  Started around Easter (07/23/12). He thought it was from pollen and maybe a cold.  He had symptoms for about 7-10 days.  Was taking delsym.  Everything got better except for the cough; the cough has gotten some better but not resolved.  He's had this happen prev, years ago.  He wanted to get checked. He still has episodic coughing fits, triggered by talking, laughing, singing, etc. Dry cough.  He doesn't feel sick. No h/o asthma.  No wheeze.  Nonsmoker, no meds.    He had a surprise birthday party last night.    Meds, vitals, and allergies reviewed.   ROS: See HPI.  Otherwise, noncontributory.  GEN: nad, alert and oriented HEENT: mucous membranes moist, tm wnl, nasal and OP exam wnl NECK: supple w/o LA CV: rrr.  PULM: ctab, no inc wob

## 2012-10-25 ENCOUNTER — Telehealth: Payer: Self-pay

## 2012-10-25 NOTE — Telephone Encounter (Signed)
Pt left v/m requesting shingles vaccine prescription to [facility]. Pt request cb when done. Pt said insurance co.will approve vaccine at [facility].

## 2012-11-03 MED ORDER — ZOSTER VACCINE LIVE 19400 UNT/0.65ML ~~LOC~~ SOLR: 0.6500 mL | Freq: Once | SUBCUTANEOUS | Status: DC

## 2012-11-03 NOTE — Telephone Encounter (Signed)
Pt called back to ck on status of request for shingles vaccine rx.Please advise.

## 2012-11-03 NOTE — Telephone Encounter (Signed)
Patient notified as instructed by telephone.  Checked with pharmacy and was advised that they did received the script.

## 2012-11-03 NOTE — Telephone Encounter (Signed)
I apologize for the day but I don't think this was routed to me initially.  rx sent.  Please check on the routing to clarify this.  Thanks.

## 2013-01-01 ENCOUNTER — Encounter: Payer: Self-pay | Admitting: Family Medicine

## 2013-01-01 ENCOUNTER — Ambulatory Visit (INDEPENDENT_AMBULATORY_CARE_PROVIDER_SITE_OTHER): Payer: Medicare Other | Admitting: Family Medicine

## 2013-01-01 VITALS — BP 144/80 | HR 68 | Temp 99.5°F | Wt 219.5 lb

## 2013-01-01 DIAGNOSIS — J019 Acute sinusitis, unspecified: Secondary | ICD-10-CM

## 2013-01-01 MED ORDER — AMOXICILLIN-POT CLAVULANATE 875-125 MG PO TABS: 1.0000 | ORAL_TABLET | Freq: Two times a day (BID) | ORAL | Status: DC

## 2013-01-01 NOTE — Patient Instructions (Addendum)
Drink plenty of fluids, take tylenol as needed, and gargle with warm salt water for your throat.  Start the antibiotics today.  This should gradually improve.  Take care.  Let us know if you have other concerns.    

## 2013-01-01 NOTE — Assessment & Plan Note (Signed)
Would treat given the exam, f/u prn.  Supportive tx in meantime. He agrees.  Nontoxic.

## 2013-01-01 NOTE — Progress Notes (Signed)
Sx started about 1 week ago.  HA initially, then congestion, then more congestion. Voice is altered. Cough, some clear sputum. Some postnasal gtt.  Facial pressure.  No tooth pain.  Mild ST, likely from cough per patient.  B ear pain.  Recently started on mucinex D.  Mild fevers and chills initially.  Has been clammy occ.    Meds, vitals, and allergies reviewed.   ROS: See HPI.  Otherwise, noncontributory.  GEN: nad, alert and oriented HEENT: mucous membranes moist, tm w/o erythema, nasal exam w/o erythema, clear discharge noted,  OP with cobblestoning, sinuses "feel tight" to patient on percussion x4 NECK: supple w/o LA CV: rrr.   PULM: ctab, no inc wob EXT: no edema SKIN: no acute rash

## 2013-01-17 ENCOUNTER — Ambulatory Visit (INDEPENDENT_AMBULATORY_CARE_PROVIDER_SITE_OTHER): Payer: Medicare Other | Admitting: Internal Medicine

## 2013-01-17 ENCOUNTER — Encounter: Payer: Self-pay | Admitting: Internal Medicine

## 2013-01-17 VITALS — BP 140/82 | HR 89 | Temp 98.9°F | Wt 218.5 lb

## 2013-01-17 DIAGNOSIS — M109 Gout, unspecified: Secondary | ICD-10-CM

## 2013-01-17 DIAGNOSIS — M79609 Pain in unspecified limb: Secondary | ICD-10-CM

## 2013-01-17 DIAGNOSIS — M79675 Pain in left toe(s): Secondary | ICD-10-CM

## 2013-01-17 DIAGNOSIS — M7989 Other specified soft tissue disorders: Secondary | ICD-10-CM

## 2013-01-17 LAB — URIC ACID: Uric Acid, Serum: 7.7 mg/dL (ref 4.0–7.8)

## 2013-01-17 MED ORDER — PREDNISONE 10 MG PO TABS: ORAL_TABLET | ORAL | Status: DC

## 2013-01-17 NOTE — Patient Instructions (Signed)
Gout  Gout is an inflammatory condition (arthritis) caused by a buildup of uric acid crystals in the joints. Uric acid is a chemical that is normally present in the blood. Under some circumstances, uric acid can form into crystals in your joints. This causes joint redness, soreness, and swelling (inflammation). Repeat attacks are common. Over time, uric acid crystals can form into masses (tophi) near a joint, causing disfigurement. Gout is treatable and often preventable.  CAUSES   The disease begins with elevated levels of uric acid in the blood. Uric acid is produced by your body when it breaks down a naturally found substance called purines. This also happens when you eat certain foods such as meats and fish. Causes of an elevated uric acid level include:   Being passed down from parent to child (heredity).   Diseases that cause increased uric acid production (obesity, psoriasis, some cancers).   Excessive alcohol use.   Diet, especially diets rich in meat and seafood.   Medicines, including certain cancer-fighting drugs (chemotherapy), diuretics, and aspirin.   Chronic kidney disease. The kidneys are no longer able to remove uric acid well.   Problems with metabolism.  Conditions strongly associated with gout include:   Obesity.   High blood pressure.   High cholesterol.   Diabetes.  Not everyone with elevated uric acid levels gets gout. It is not understood why some people get gout and others do not. Surgery, joint injury, and eating too much of certain foods are some of the factors that can lead to gout.  SYMPTOMS    An attack of gout comes on quickly. It causes intense pain with redness, swelling, and warmth in a joint.   Fever can occur.   Often, only one joint is involved. Certain joints are more commonly involved:   Base of the big toe.   Knee.   Ankle.   Wrist.   Finger.  Without treatment, an attack usually goes away in a few days to weeks. Between attacks, you usually will not have  symptoms, which is different from many other forms of arthritis.  DIAGNOSIS   Your caregiver will suspect gout based on your symptoms and exam. Removal of fluid from the joint (arthrocentesis) is done to check for uric acid crystals. Your caregiver will give you a medicine that numbs the area (local anesthetic) and use a needle to remove joint fluid for exam. Gout is confirmed when uric acid crystals are seen in joint fluid, using a special microscope. Sometimes, blood, urine, and X-ray tests are also used.  TREATMENT   There are 2 phases to gout treatment: treating the sudden onset (acute) attack and preventing attacks (prophylaxis).  Treatment of an Acute Attack   Medicines are used. These include anti-inflammatory medicines or steroid medicines.   An injection of steroid medicine into the affected joint is sometimes necessary.   The painful joint is rested. Movement can worsen the arthritis.   You may use warm or cold treatments on painful joints, depending which works best for you.   Discuss the use of coffee, vitamin C, or cherries with your caregiver. These may be helpful treatment options.  Treatment to Prevent Attacks  After the acute attack subsides, your caregiver may advise prophylactic medicine. These medicines either help your kidneys eliminate uric acid from your body or decrease your uric acid production. You may need to stay on these medicines for a very long time.  The early phase of treatment with prophylactic medicine can be associated   with an increase in acute gout attacks. For this reason, during the first few months of treatment, your caregiver may also advise you to take medicines usually used for acute gout treatment. Be sure you understand your caregiver's directions.  You should also discuss dietary treatment with your caregiver. Certain foods such as meats and fish can increase uric acid levels. Other foods such as dairy can decrease levels. Your caregiver can give you a list of foods  to avoid.  HOME CARE INSTRUCTIONS    Do not take aspirin to relieve pain. This raises uric acid levels.   Only take over-the-counter or prescription medicines for pain, discomfort, or fever as directed by your caregiver.   Rest the joint as much as possible. When in bed, keep sheets and blankets off painful areas.   Keep the affected joint raised (elevated).   Use crutches if the painful joint is in your leg.   Drink enough water and fluids to keep your urine clear or pale yellow. This helps your body get rid of uric acid. Do not drink alcoholic beverages. They slow the passage of uric acid.   Follow your caregiver's dietary instructions. Pay careful attention to the amount of protein you eat. Your daily diet should emphasize fruits, vegetables, whole grains, and fat-free or low-fat milk products.   Maintain a healthy body weight.  SEEK MEDICAL CARE IF:    You have an oral temperature above 102 F (38.9 C).   You develop diarrhea, vomiting, or any side effects from medicines.   You do not feel better in 24 hours, or you are getting worse.  SEEK IMMEDIATE MEDICAL CARE IF:    Your joint becomes suddenly more tender and you have:   Chills.   An oral temperature above 102 F (38.9 C), not controlled by medicine.  MAKE SURE YOU:    Understand these instructions.   Will watch your condition.   Will get help right away if you are not doing well or get worse.  Document Released: 03/19/2000 Document Revised: 06/14/2011 Document Reviewed: 06/30/2009  Exit[employer] Patient Information 2014 [employer].

## 2013-01-17 NOTE — Progress Notes (Signed)
Subjective:    Patient ID: Alan Castillo, male    DOB: [birth year], 66 y.o.   MRN: [id number]  HPI  Pt presents to the clinic today with c/o pain and swelling of the left big toe. This started about a week ago but seems to have gotten progressively worse overnight. The toe is red, warm and very tender to touch. He has not had an injury to the foot. He has no history of gout that he is aware of. He does not drink alcohol or eat a lot of red meats.  Review of Systems      Past Medical History  Diagnosis Date  . BPH (benign prostatic hyperplasia)     Current Outpatient Prescriptions  Medication Sig Dispense Refill  . amoxicillin-clavulanate (AUGMENTIN) 875-125 MG per tablet Take 1 tablet by mouth 2 (two) times daily.  20 tablet  0   No current facility-administered medications for this visit.    No Known Allergies  Family History  Problem Relation Age of Onset  . Hypothyroidism Mother   . Heart disease Mother     CHF, MI  . Cancer Father     prostate, lung  . Prostate cancer Father   . Hypertension Sister   . Stroke Maternal Grandfather   . Colon cancer Neg Hx     History   Social History  . Marital Status: Married    Spouse Name: N/A    Number of Children: 3  . Years of Education: N/A   Occupational History  . 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 Smoking status: Never Smoker   . Smokeless tobacco: Never Used  . Alcohol Use: No  . Drug Use: No  . Sexual Activity: Yes   Other Topics Concern  . Not on file   Social History Narrative   Married 1968, lives with wife   [employer] [place] [place]   3 kids, 2 are Programmer, systems     Constitutional: Denies fever, malaise, fatigue, headache or abrupt weight changes.  Musculoskeletal: Pt reports pain and swelling of the left big toe. Denies difficulty with gait, muscle pain.  Skin: Denies redness, rashes, lesions or ulcercations.    No other specific  complaints in a complete review of systems (except as listed in HPI above).  Objective:   Physical Exam  BP 140/82  Pulse 89  Temp(Src) 98.9 F (37.2 C) (Oral)  Wt 218 lb 8 oz (99.111 kg)  BMI 30.49 kg/m2  SpO2 96% Wt Readings from Last 3 Encounters:  01/17/13 218 lb 8 oz (99.111 kg)  01/01/13 219 lb 8 oz (99.565 kg)  09/11/12 222 lb 8 oz (100.925 kg)    General: Appears his stated age, well developed, well nourished in NAD. Skin: Warm, dry and intact. Left DIP very red and tender to touch. Cardiovascular: Normal rate and rhythm. S1,S2 noted.  No murmur, rubs or gallops noted. No JVD or BLE edema. No carotid bruits noted. Pulmonary/Chest: Normal effort and positive vesicular breath sounds. No respiratory distress. No wheezes, rales or ronchi noted.  Musculoskeletal: Decreased flexion/extension of left big toe secondary to pain. 1 + DIP joint swelling.    BMET    Component Value Date/Time   NA 140 03/31/2012 0835   K 4.3 03/31/2012 0835   CL 104 03/31/2012 0835   CO2 28 03/31/2012 0835   GLUCOSE 101* 03/31/2012 0835   BUN 18 03/31/2012 0835   CREATININE  1.1 03/31/2012 0835   CALCIUM 9.3 03/31/2012 0835   GFRNONAA 74.63 03/23/2010 0824   GFRAA 87 03/18/2008 1018    Lipid Panel     Component Value Date/Time   CHOL 167 03/31/2012 0835   TRIG 88.0 03/31/2012 0835   HDL 38.10* 03/31/2012 0835   CHOLHDL 4 03/31/2012 0835   VLDL 17.6 03/31/2012 0835   LDLCALC 111* 03/31/2012 0835    CBC    Component Value Date/Time   WBC 5.9 03/24/2011 0901   RBC 4.99 03/24/2011 0901   HGB 15.6 03/24/2011 0901   HCT 46.2 03/24/2011 0901   PLT 228.0 03/24/2011 0901   MCV 92.7 03/24/2011 0901   MCHC 33.7 03/24/2011 0901   RDW 12.2 03/24/2011 0901   LYMPHSABS 1.8 03/24/2011 0901   MONOABS 0.7 03/24/2011 0901   EOSABS 0.2 03/24/2011 0901   BASOSABS 0.0 03/24/2011 0901    Hgb A1C No results found for this basename: HGBA1C         Assessment & Plan:   Redness, swelling  and warmth of DIP joint, left big toe, likely secondary to gout:  eRx for pred taper Will obtain uric acid level- will call you with the result  RTC as needed or if pain/inflammation gets worse or persist despite treatment

## 2013-02-08 ENCOUNTER — Other Ambulatory Visit: Payer: Self-pay

## 2013-02-22 ENCOUNTER — Other Ambulatory Visit: Payer: Self-pay | Admitting: Internal Medicine

## 2013-02-22 DIAGNOSIS — M109 Gout, unspecified: Secondary | ICD-10-CM

## 2013-02-22 DIAGNOSIS — M7989 Other specified soft tissue disorders: Secondary | ICD-10-CM

## 2013-02-22 DIAGNOSIS — M79675 Pain in left toe(s): Secondary | ICD-10-CM

## 2013-02-23 MED ORDER — PREDNISONE 10 MG PO TABS: ORAL_TABLET | ORAL | Status: DC

## 2013-03-27 ENCOUNTER — Encounter: Payer: Self-pay | Admitting: Family Medicine

## 2013-03-27 LAB — GLUCOSE (CC13)
A1c: 5.7
Creat: 1.1
Glucose: 93
HDL: 51 mg/dL (ref 35–70)
LDL (calc): 121

## 2013-04-12 ENCOUNTER — Other Ambulatory Visit: Payer: Medicare Other

## 2013-04-20 ENCOUNTER — Encounter: Payer: Medicare Other | Admitting: Family Medicine

## 2013-06-26 ENCOUNTER — Encounter: Payer: Self-pay | Admitting: Family Medicine

## 2013-06-26 ENCOUNTER — Ambulatory Visit (INDEPENDENT_AMBULATORY_CARE_PROVIDER_SITE_OTHER): Payer: Medicare Other | Admitting: Family Medicine

## 2013-06-26 VITALS — BP 136/86 | HR 61 | Temp 97.4°F | Ht 71.0 in | Wt 221.5 lb

## 2013-06-26 DIAGNOSIS — Z1211 Encounter for screening for malignant neoplasm of colon: Secondary | ICD-10-CM

## 2013-06-26 DIAGNOSIS — Z0000 Encounter for general adult medical examination without abnormal findings: Secondary | ICD-10-CM | POA: Insufficient documentation

## 2013-06-26 DIAGNOSIS — Z8042 Family history of malignant neoplasm of prostate: Secondary | ICD-10-CM | POA: Insufficient documentation

## 2013-06-26 DIAGNOSIS — R42 Dizziness and giddiness: Secondary | ICD-10-CM

## 2013-06-26 NOTE — Assessment & Plan Note (Signed)
PSA not elevated.

## 2013-06-26 NOTE — Progress Notes (Signed)
Pre visit review using our clinic review tool, if applicable. No additional management support is needed unless otherwise documented below in the visit note.  I have personally reviewed the Medicare Annual Wellness questionnaire and have noted 1. The patient's medical and social history 2. Their use of alcohol, tobacco or illicit drugs 3. Their current medications and supplements 4. The patient's functional ability including ADL's, fall risks, home safety risks and hearing or visual             impairment. 5. Diet and physical activities 6. Evidence for depression or mood disorders  The patients weight, height, BMI have been recorded in the chart and visual acuity is per eye clinic.  I have made referrals, counseling and provided education to the patient based review of the above and I have provided the pt with a written personalized care plan for preventive services.  See scanned forms. Routine anticipatory guidance given to patient. See health maintenance.  Flu encouraged.   Shingles 02/2013 PNA 2012  Tetanus 2013  D/w patient [id number]:options for colon cancer screening, including IFOB vs. colonoscopy. Risks and benefits of both were discussed and patient voiced understanding. Pt elects NFA:[id number]:IFOB.  Prostate cancer screening d/w pt. PSA wnl.  Advance directive d/w pt. Wife would be designated if he is incapacitated.  Cognitive function addressed- see scanned forms- and if abnormal then additional documentation follows.   He has seen ENT about his imbalance.  This is episodic and he can still ballroom dance.  He has no problems driving.   He deals with the episodic flares on his own.    PMH and SH reviewed  Meds, vitals, and allergies reviewed.   ROS: See HPI.  Otherwise negative.    GEN: nad, alert and oriented HEENT: mucous membranes moist NECK: supple w/o LA CV: rrr. PULM: ctab, no inc wob ABD: soft, +bs EXT: no edema SKIN: no acute rash

## 2013-06-26 NOTE — Assessment & Plan Note (Signed)
See scanned forms. Routine anticipatory guidance given to patient. See health maintenance.  Flu encouraged.   Shingles 02/2013 PNA 2012  Tetanus 2013  D/w patient [id number]:options for colon cancer screening, including IFOB vs. colonoscopy. Risks and benefits of both were discussed and patient voiced understanding. Pt elects WUJ:WJXBfor:IFOB.  Prostate cancer screening d/w pt. PSA wnl.  Advance directive d/w pt. Wife would be designated if he is incapacitated.  Cognitive function addressed- see scanned forms- and if abnormal then additional documentation follows.

## 2013-06-26 NOTE — Assessment & Plan Note (Signed)
He has seen ENT about his imbalance. This is episodic and he can still ballroom dance. He has no problems driving. He deals with the episodic flares on his own.

## 2013-06-26 NOTE — Patient Instructions (Signed)
Go to the lab on the way out.  We'll contact you with your lab report. Take care. Recheck in about 1 year.  Glad to see you.

## 2013-06-28 ENCOUNTER — Other Ambulatory Visit (INDEPENDENT_AMBULATORY_CARE_PROVIDER_SITE_OTHER): Payer: Medicare Other

## 2013-06-28 ENCOUNTER — Encounter: Payer: Self-pay | Admitting: *Deleted

## 2013-06-28 DIAGNOSIS — Z1211 Encounter for screening for malignant neoplasm of colon: Secondary | ICD-10-CM

## 2013-06-28 LAB — FECAL OCCULT BLOOD, IMMUNOCHEMICAL: FECAL OCCULT BLD: NEGATIVE

## 2014-03-13 ENCOUNTER — Telehealth: Payer: Self-pay

## 2014-03-13 NOTE — Telephone Encounter (Signed)
Pt has appt with Dr Para Marchuncan on 03/14/14 at 12:30 pm.

## 2014-03-13 NOTE — Telephone Encounter (Signed)
PLEASE NOTE: All timestamps contained within this report are represented as [place]. CONFIDENTIALTY NOTICE: This fax transmission is intended only for the addressee. It contains information that is legally privileged, confidential or otherwise protected from use or disclosure. If you are not the intended recipient, you are strictly prohibited from reviewing, disclosing, copying using or disseminating any of this information or taking any action in reliance on or regarding this information. If you have received this fax in error, please notify us immediately by telephone so that we can arrange for its return to us. Phone: [phone], Toll-Free: [phone], Fax: [phone] Page: 1 of 2 Call Id: [id number] [facility] Client TELEPHONE ADVICE RECORD [facility] Patient Name: Alan Castillo Castillo Gender: Male DOB: 12/25/1945 Age: [age] Y 6 M 1 D Return Phone Number: [phone] (Primary) Address: City[place]: [place] [id number] Client [facility] Day Physician Raechel Acheuncan, Shaw Contact Type Call Call Type Triage / Clinical Relationship To Patient Self Return Phone Number [phone] (Primary) Chief Complaint Headache Initial Comment Caller states he has sore throat, congestion, cough and headache, [facility] due to no appts available today PreDisposition Call Doctor Nurse Assessment Nurse: Anner CreteWells, RN, Olegario MessierKathy Date/Time (Eastern Time): 03/13/2014 9:34:20 AM Confirm and document reason for call. If symptomatic, describe symptoms. ---Caller states that he has a sore throat, a mild headache and congestion. Has the patient traveled out of the country within the last 30 days? ---Not Applicable Does the patient require triage? ---Yes Related visit to physician within the last 2 weeks? ---No Does the PT have any chronic conditions?  (i.e. diabetes, asthma, etc.) ---No Guidelines Guideline Title Affirmed Question Affirmed Notes Nurse Date/Time Alan Castillo(Eastern Time) Sore Throat Earache also present Anner CreteWells, RN, Olegario MessierKathy 03/13/2014 9:35:32 AM Disp. Time Alan Castillo(Eastern Time) Disposition Final User 03/13/2014 9:25:30 AM Send To RN Personal Anner CreteWells, RN, Olegario MessierKathy 03/13/2014 9:42:37 AM See Physician within 24 Hours Yes Anner CreteWells, RN, Sammuel BailiffKathy Caller Understands: Yes Disagree/Comply: Comply Care Advice Given Per Guideline SEE PHYSICIAN WITHIN 24 HOURS: * IF OFFICE WILL BE OPEN: You need to be examined within the next 24 hours. Call your doctor when the office opens, and make an appointment. SORE THROAT - For relief of sore throat: * Sip warm chicken broth PLEASE NOTE: All timestamps contained within this report are represented as [place] Standard [place]. CONFIDENTIALTY NOTICE: This fax transmission is intended only for the addressee. It contains information that is legally privileged, confidential or otherwise protected from use or disclosure. If you are not the intended recipient, you are strictly prohibited from reviewing, disclosing, copying using or disseminating any of this information or taking any action in reliance on or regarding this information. If you have received this fax in error, please notify us immediately by telephone so that we can arrange for its return to us. Phone: [phone], Toll-Free: [phone], Fax: [phone] Page: 2 of 2 Call Id: [id number] Care Advice Given Per Guideline or apple juice. * Suck on hard candy or a throat lozenge (OTC). * Gargle with warm salt water four times a day. To make salt water, put 1/2 teaspoon of salt in 8 oz (240 ml) of warm water. SOFT DIET: * Eat a soft diet. Cold drinks, popsicles, and milk shakes are especially good. Avoid citrus fruits. * Drink plenty of liquids so as to avoid dehydration (8-12 eight oz glasses each day). CALL BACK IF: * You  become worse. CARE ADVICE given per Sore Throat (Adult)  guideline. NAPROXEN (E.G., ALEVE): * Take 220 mg (one 220 mg pill) by mouth every 8 hours as needed. You may take 440 mg (two 220 mg pills) for your first dose. * The most you should take each day is 660 mg (three 220 mg pills a day), unless your doctor has told you to take more. After Care Instructions Given Call Event Type User Date / Time Description Comments User: Alphonzo CruiseKathy, Wells, RN Date/Time Alan Castillo(Eastern Time): 03/13/2014 9:42:00 AM [facility] Referrals REFERRED TO PCP OFFICE

## 2014-03-14 ENCOUNTER — Ambulatory Visit (INDEPENDENT_AMBULATORY_CARE_PROVIDER_SITE_OTHER): Payer: Medicare Other | Admitting: Family Medicine

## 2014-03-14 ENCOUNTER — Encounter: Payer: Self-pay | Admitting: Family Medicine

## 2014-03-14 VITALS — BP 124/76 | HR 70 | Temp 99.0°F | Wt 217.8 lb

## 2014-03-14 DIAGNOSIS — J0100 Acute maxillary sinusitis, unspecified: Secondary | ICD-10-CM

## 2014-03-14 MED ORDER — AMOXICILLIN-POT CLAVULANATE 875-125 MG PO TABS: 1.0000 | ORAL_TABLET | Freq: Two times a day (BID) | ORAL | Status: DC

## 2014-03-14 NOTE — Patient Instructions (Signed)
Start the antibiotics today.  Drink plenty of fluids, take aleve with food as needed, and gargle with warm salt water for your throat.   This should gradually improve.  Take care.  Let us know if you have other concerns.

## 2014-03-14 NOTE — Progress Notes (Signed)
Pre visit review using our clinic review tool, if applicable. No additional management support is needed unless otherwise documented below in the visit note.  Sx started about 1 week ago, really ST initially.  "I knew I was coming down with something."  A lot of congestion, HA. Facial pain.  No tooth pain.  No fevers.  Some chills and that is atypical.  Some aches, esp in the legs.  Taking aleve prn in the meantime.  No vomiting, no diarrhea.  Some cough, started today.   Not much sputum.  Some post nasal gtt.   Meds, vitals, and allergies reviewed.   ROS: See HPI.  Otherwise, noncontributory.  GEN: nad, alert and oriented HEENT: mucous membranes moist, tm w/o erythema, nasal exam w/o erythema, clear discharge noted,  OP with cobblestoning, sinuses ttp x4 NECK: supple w/o LA CV: rrr.   PULM: ctab, no inc wob EXT: no edema SKIN: no acute rash

## 2014-03-17 DIAGNOSIS — J019 Acute sinusitis, unspecified: Secondary | ICD-10-CM | POA: Insufficient documentation

## 2014-03-17 NOTE — Assessment & Plan Note (Signed)
Nontoxic, augmentin, f/u prn.  Supportive care.

## 2014-04-08 ENCOUNTER — Telehealth: Payer: Self-pay | Admitting: Family Medicine

## 2014-04-08 MED ORDER — LIDOCAINE VISCOUS 2 % MT SOLN: 10.0000 mL | OROMUCOSAL | Status: DC | PRN

## 2014-04-08 NOTE — Telephone Encounter (Signed)
We need to recheck him, especially with the sores in his mouth.  I sent some lidocaine to the pharmacy in the meantime, for the oral lesions, if they are painful.

## 2014-04-08 NOTE — Telephone Encounter (Signed)
Patient advised. Appt scheduled with Nicki Reaper at 11:30 on 04/09/14.

## 2014-04-08 NOTE — Telephone Encounter (Signed)
Pt called and request another round of abx? Pt now has a lot of congestion that has moved to chest. Pt also states he has sores in his mouth. Please advise.   [facility]

## 2014-04-09 ENCOUNTER — Encounter: Payer: Self-pay | Admitting: Internal Medicine

## 2014-04-09 ENCOUNTER — Ambulatory Visit (INDEPENDENT_AMBULATORY_CARE_PROVIDER_SITE_OTHER): Payer: Medicare Other | Admitting: Internal Medicine

## 2014-04-09 VITALS — BP 124/86 | HR 78 | Temp 98.1°F | Wt 216.0 lb

## 2014-04-09 DIAGNOSIS — J0100 Acute maxillary sinusitis, unspecified: Secondary | ICD-10-CM

## 2014-04-09 MED ORDER — PREDNISONE 10 MG PO TABS: ORAL_TABLET | ORAL | Status: DC

## 2014-04-09 NOTE — Progress Notes (Signed)
Pre visit review using our clinic review tool, if applicable. No additional management support is needed unless otherwise documented below in the visit note. 

## 2014-04-09 NOTE — Progress Notes (Signed)
HPI  Pt presents to the clinic today with c/o continued head pressure, post nasal drip and cough. He reports this started 1 month ago. He saw Dr. Para March 03/14/14 for the same. He was given Augmentin for a sinus infection and reports it helped but his symptoms never really went away. His cough is productive of thick white mucous. He has also developed a sore on the top of his mouth. It is very painful. He denies fever, chills or body aches. He has tried Robitussin and Aleve with some relief. He has no history of allergies or breathing problems. He has not had sick contacts.  Review of Systems      Past Medical History  Diagnosis Date  . BPH (benign prostatic hyperplasia)     Family History  Problem Relation Age of Onset  . Hypothyroidism Mother   . Heart disease Mother     CHF, MI  . Cancer Father     prostate, lung  . Prostate cancer Father   . Hypertension Sister   . Stroke Maternal Grandfather   . Colon cancer Neg Hx     History   Social History  . Marital Status: Married    Spouse Name: N/A    Number of Children: 3  . Years of Education: N/A   Occupational History  . 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 Smoking status: Never Smoker   . Smokeless tobacco: Never Used  . Alcohol Use: No  . Drug Use: No  . Sexual Activity: Yes   Other Topics Concern  . Not on file   Social History Narrative   Married 1968, lives with wife   [employer] [place] [employer] [place]   3 kids, 2 are Programmer, systems    No Known Allergies   Constitutional: Positive headache, fatigue and fever. Denies fever or abrupt weight changes.  HEENT:  Positive sore throat and mouth ulcer. Denies eye redness, eye pain, pressure behind the eyes, facial pain, nasal congestion, ear pain, ringing in the ears, wax buildup, runny nose or bloody nose. Respiratory: Positive cough. Denies difficulty breathing or shortness of breath.  Cardiovascular: Denies chest  pain, chest tightness, palpitations or swelling in the hands or feet.   No other specific complaints in a complete review of systems (except as listed in HPI above).  Objective:   BP 124/86 mmHg  Pulse 78  Temp(Src) 98.1 F (36.7 C) (Oral)  Wt 216 lb (97.977 kg)  SpO2 98% Wt Readings from Last 3 Encounters:  04/09/14 216 lb (97.977 kg)  03/14/14 217 lb 12 oz (98.771 kg)  06/26/13 221 lb 8 oz (100.472 kg)     General: Appears his stated age, well developed, well nourished in NAD. HEENT: Head: normal shape and size, no sinus tenderness noted; Eyes: sclera white, no icterus, conjunctiva pink Ears: Tm's gray and intact, normal light reflex; Nose: mucosa pink and moist, septum midline; Throat/Mouth: + PND. Teeth present, mucosa pink and moist, no exudate noted, no lesions or ulcerations noted.  Neck: No cervical lymphadenopathy.  Cardiovascular: Normal rate and rhythm. S1,S2 noted.  No murmur, rubs or gallops noted.  Pulmonary/Chest: Normal effort and positive vesicular breath sounds. No respiratory distress. No wheezes, rales or ronchi noted.      Assessment & Plan:   Acute Maxillary Sinusitis, not resolved:  Get some rest and drink plenty of water Do salt water gargles/Ibuprofen for the sore throat eRx for Pred taper  x 6 days Continue Robitussin for cough  RTC as needed or if symptoms persist.

## 2014-04-09 NOTE — Patient Instructions (Signed)

## 2014-06-23 ENCOUNTER — Other Ambulatory Visit: Payer: Self-pay | Admitting: Family Medicine

## 2014-06-23 DIAGNOSIS — Z125 Encounter for screening for malignant neoplasm of prostate: Secondary | ICD-10-CM

## 2014-06-23 DIAGNOSIS — Z8639 Personal history of other endocrine, nutritional and metabolic disease: Secondary | ICD-10-CM

## 2014-06-24 ENCOUNTER — Other Ambulatory Visit (INDEPENDENT_AMBULATORY_CARE_PROVIDER_SITE_OTHER): Payer: Medicare Other

## 2014-06-24 DIAGNOSIS — Z125 Encounter for screening for malignant neoplasm of prostate: Secondary | ICD-10-CM

## 2014-06-24 DIAGNOSIS — Z8639 Personal history of other endocrine, nutritional and metabolic disease: Secondary | ICD-10-CM

## 2014-06-24 LAB — COMPREHENSIVE METABOLIC PANEL
ALT: 12 U/L (ref 0–53)
AST: 19 U/L (ref 0–37)
Albumin: 4.2 g/dL (ref 3.5–5.2)
Alkaline Phosphatase: 48 U/L (ref 39–117)
BUN: 14 mg/dL (ref 6–23)
CALCIUM: 9.3 mg/dL (ref 8.4–10.5)
CO2: 29 mEq/L (ref 19–32)
CREATININE: 1.08 mg/dL (ref 0.40–1.50)
Chloride: 105 mEq/L (ref 96–112)
GFR: 72.1 mL/min (ref >60.00)
Glucose, Bld: 95 mg/dL (ref 70–99)
Potassium: 4.3 mEq/L (ref 3.5–5.1)
Sodium: 139 mEq/L (ref 135–145)
Total Bilirubin: 0.6 mg/dL (ref 0.2–1.2)
Total Protein: 6.6 g/dL (ref 6.0–8.3)

## 2014-06-24 LAB — LIPID PANEL
Cholesterol: 156 mg/dL (ref 0–200)
HDL: 43.8 mg/dL (ref >39.00)
LDL CALC: 96 mg/dL (ref 0–99)
NonHDL: 112.2
TRIGLYCERIDES: 79 mg/dL (ref 0.0–149.0)
Total CHOL/HDL Ratio: 4
VLDL: 15.8 mg/dL (ref 0.0–40.0)

## 2014-06-24 LAB — PSA, MEDICARE: PSA: 0.46 ng/mL (ref 0.10–4.00)

## 2014-06-28 ENCOUNTER — Encounter: Payer: Medicare Other | Admitting: Family Medicine

## 2014-07-04 ENCOUNTER — Encounter: Payer: Self-pay | Admitting: Family Medicine

## 2014-07-04 ENCOUNTER — Ambulatory Visit (INDEPENDENT_AMBULATORY_CARE_PROVIDER_SITE_OTHER): Payer: Medicare Other | Admitting: Family Medicine

## 2014-07-04 VITALS — BP 132/84 | HR 63 | Temp 97.9°F | Ht 71.0 in | Wt 219.2 lb

## 2014-07-04 DIAGNOSIS — Z0000 Encounter for general adult medical examination without abnormal findings: Secondary | ICD-10-CM

## 2014-07-04 DIAGNOSIS — Z1211 Encounter for screening for malignant neoplasm of colon: Secondary | ICD-10-CM

## 2014-07-04 DIAGNOSIS — Z23 Encounter for immunization: Secondary | ICD-10-CM | POA: Diagnosis not present

## 2014-07-04 DIAGNOSIS — Z7189 Other specified counseling: Secondary | ICD-10-CM | POA: Insufficient documentation

## 2014-07-04 DIAGNOSIS — K130 Diseases of lips: Secondary | ICD-10-CM

## 2014-07-04 NOTE — Progress Notes (Signed)
Pre visit review using our clinic review tool, if applicable. No additional management support is needed unless otherwise documented below in the visit note.  I have personally reviewed the Medicare Annual Wellness questionnaire and have noted 1. The patient's medical and social history 2. Their use of alcohol, tobacco or illicit drugs 3. Their current medications and supplements 4. The patient's functional ability including ADL's, fall risks, home safety risks and hearing or visual             impairment. 5. Diet and physical activities 6. Evidence for depression or mood disorders  The patients weight, height, BMI have been recorded in the chart and visual acuity is per eye clinic.  I have made referrals, counseling and provided education to the patient based review of the above and I have provided the pt with a written personalized care plan for preventive services.  Provider list updated- see scanned forms.  Routine anticipatory guidance given to patient.  See health maintenance. Flu encouraged.  Shingles 02/2013 PNA 2012  Tetanus 2013  D/w patient ZO:XWRUEAVre:options for colon cancer screening, including IFOB vs. colonoscopy. Risks and benefits of both were discussed and patient voiced understanding. Pt elects WUJ:WJXBfor:IFOB.  Prostate cancer screening d/w pt. PSA wnl.  Advance directive d/w pt. Wife would be designated if he is incapacitated.  Cognitive function addressed- see scanned forms- and if abnormal then additional documentation follows.   Lower lip lesion present for a few years.  No clear trigger.  Not painful.  Still with a persistent knot.  It hasn't change in the last few years.    PMH and SH reviewed  Meds, vitals, and allergies reviewed.   ROS: See HPI.  Otherwise negative.    GEN: nad, alert and oriented HEENT: mucous membranes moist, OP wnl except for small knot in the lower lip at the midline, not ttp NECK: supple w/o LA CV: rrr. PULM: ctab, no inc wob ABD: soft,  +bs EXT: no edema SKIN: no acute rash

## 2014-07-04 NOTE — Patient Instructions (Signed)
Go to the lab on the way out.  We'll contact you with your lab report (stool cards). Ask your dentist about getting the lip lesion removed.  If needed we can refer you to ENT.   The lesion looks benign.  Take care.  Keep exercising.  Glad to see you.  Recheck in about 1 year.  I would get a flu shot each fall.

## 2014-07-04 NOTE — Assessment & Plan Note (Signed)
Routine anticipatory guidance given to patient. See health maintenance.  Flu encouraged.  Shingles 02/2013  PNA 2012  Tetanus 2013  D/w patient EA:VWUJWJXre:options for colon cancer screening, including IFOB vs. colonoscopy. Risks and benefits of both were discussed and patient voiced understanding. Pt elects BJY:NWGNfor:IFOB.  Prostate cancer screening d/w pt. PSA wnl.  Advance directive d/w pt. Wife would be designated if he is incapacitated.  Cognitive function addressed- see scanned forms- and if abnormal then additional documentation follows.

## 2014-07-04 NOTE — Assessment & Plan Note (Signed)
He'll check with dental clinic for eval.  If needed, we can refer to ENT.  Looks benign.  Pt agrees. He'll update me as needed.

## 2014-07-09 ENCOUNTER — Other Ambulatory Visit (INDEPENDENT_AMBULATORY_CARE_PROVIDER_SITE_OTHER): Payer: Medicare Other

## 2014-07-09 DIAGNOSIS — Z1211 Encounter for screening for malignant neoplasm of colon: Secondary | ICD-10-CM

## 2014-07-11 LAB — FECAL OCCULT BLOOD, IMMUNOCHEMICAL: FECAL OCCULT BLD: NEGATIVE

## 2014-07-12 ENCOUNTER — Encounter: Payer: Self-pay | Admitting: *Deleted

## 2015-01-10 ENCOUNTER — Ambulatory Visit (INDEPENDENT_AMBULATORY_CARE_PROVIDER_SITE_OTHER): Payer: Medicare Other

## 2015-01-10 DIAGNOSIS — Z23 Encounter for immunization: Secondary | ICD-10-CM

## 2015-06-02 DIAGNOSIS — G4733 Obstructive sleep apnea (adult) (pediatric): Secondary | ICD-10-CM | POA: Diagnosis not present

## 2015-06-22 ENCOUNTER — Other Ambulatory Visit: Payer: Self-pay | Admitting: Family Medicine

## 2015-06-22 DIAGNOSIS — Z8639 Personal history of other endocrine, nutritional and metabolic disease: Secondary | ICD-10-CM

## 2015-06-22 DIAGNOSIS — Z125 Encounter for screening for malignant neoplasm of prostate: Secondary | ICD-10-CM

## 2015-06-30 ENCOUNTER — Other Ambulatory Visit (INDEPENDENT_AMBULATORY_CARE_PROVIDER_SITE_OTHER): Payer: Medicare Other

## 2015-06-30 DIAGNOSIS — Z125 Encounter for screening for malignant neoplasm of prostate: Secondary | ICD-10-CM | POA: Diagnosis not present

## 2015-06-30 DIAGNOSIS — Z8639 Personal history of other endocrine, nutritional and metabolic disease: Secondary | ICD-10-CM

## 2015-06-30 LAB — COMPREHENSIVE METABOLIC PANEL
ALBUMIN: 4.3 g/dL (ref 3.5–5.2)
ALT: 13 U/L (ref 0–53)
AST: 17 U/L (ref 0–37)
Alkaline Phosphatase: 52 U/L (ref 39–117)
BUN: 18 mg/dL (ref 6–23)
CALCIUM: 9.5 mg/dL (ref 8.4–10.5)
CHLORIDE: 103 meq/L (ref 96–112)
CO2: 31 meq/L (ref 19–32)
CREATININE: 1.19 mg/dL (ref 0.40–1.50)
GFR: 64.27 mL/min (ref >60.00)
Glucose, Bld: 96 mg/dL (ref 70–99)
POTASSIUM: 4.2 meq/L (ref 3.5–5.1)
Sodium: 139 mEq/L (ref 135–145)
Total Bilirubin: 0.9 mg/dL (ref 0.2–1.2)
Total Protein: 6.8 g/dL (ref 6.0–8.3)

## 2015-06-30 LAB — LIPID PANEL
CHOL/HDL RATIO: 4
Cholesterol: 169 mg/dL (ref 0–200)
HDL: 42.5 mg/dL (ref >39.00)
LDL CALC: 111 mg/dL — AB (ref 0–99)
NonHDL: 126.59
TRIGLYCERIDES: 79 mg/dL (ref 0.0–149.0)
VLDL: 15.8 mg/dL (ref 0.0–40.0)

## 2015-06-30 LAB — PSA, MEDICARE: PSA: 0.47 ng/ml (ref 0.10–4.00)

## 2015-07-07 ENCOUNTER — Ambulatory Visit (INDEPENDENT_AMBULATORY_CARE_PROVIDER_SITE_OTHER): Payer: Medicare Other | Admitting: Family Medicine

## 2015-07-07 ENCOUNTER — Encounter: Payer: Self-pay | Admitting: Family Medicine

## 2015-07-07 VITALS — BP 134/80 | HR 60 | Temp 97.7°F | Wt 216.0 lb

## 2015-07-07 DIAGNOSIS — Z1211 Encounter for screening for malignant neoplasm of colon: Secondary | ICD-10-CM

## 2015-07-07 DIAGNOSIS — Z0000 Encounter for general adult medical examination without abnormal findings: Secondary | ICD-10-CM | POA: Diagnosis not present

## 2015-07-07 DIAGNOSIS — Z119 Encounter for screening for infectious and parasitic diseases, unspecified: Secondary | ICD-10-CM

## 2015-07-07 NOTE — Assessment & Plan Note (Signed)
Flu up to date.  Shingles up to date.  PNA up to date.  Tetanus up to date.  D/w patient ZO:XWRUEAVre:options for colon cancer screening, including IFOB vs. colonoscopy.  Risks and benefits of both were discussed and patient voiced understanding.  Pt elects WUJ:WJXBfor:IFOB.  Prostate cancer screening- PSA wnl, FH noted.  D/w pt.  Advance directive - wife designated if patient were incapacitated.  Cognitive function addressed- see scanned forms- and if abnormal then additional documentation follows.  Labs d/w pt.  Minimal inc in LDL, not in need of statin tx.   Sugar wnl.  D/w pt.   Pt opts in for HCV screening with next set of labs.  D/w pt re: routine screening.

## 2015-07-07 NOTE — Patient Instructions (Signed)
Go to the lab on the way out.  We'll contact you with your lab report. Take care.  Glad to see you.  

## 2015-07-07 NOTE — Progress Notes (Signed)
Pre visit review using our clinic review tool, if applicable. No additional management support is needed unless otherwise documented below in the visit note.  I have personally reviewed the Medicare Annual Wellness questionnaire and have noted 1. The patient's medical and social history 2. Their use of alcohol, tobacco or illicit drugs 3. Their current medications and supplements 4. The patient's functional ability including ADL's, fall risks, home safety risks and hearing or visual             impairment. 5. Diet and physical activities 6. Evidence for depression or mood disorders  The patients weight, height, BMI have been recorded in the chart and visual acuity is per eye clinic.  I have made referrals, counseling and provided education to the patient based review of the above and I have provided the pt with a written personalized care plan for preventive services.  Provider list updated- see scanned forms.  Routine anticipatory guidance given to patient.  See health maintenance.  Flu up to date.  Shingles up to date.  PNA up to date.  Tetanus up to date.  D/w patient OZ:HYQMVHQre:options for colon cancer screening, including IFOB vs. colonoscopy.  Risks and benefits of both were discussed and patient voiced understanding.  Pt elects ION:GEXBfor:IFOB.  Prostate cancer screening- PSA wnl, FH noted.  D/w pt.  Advance directive - wife designated if patient were incapacitated.  Cognitive function addressed- see scanned forms- and if abnormal then additional documentation follows.  Labs d/w pt.  Minimal inc in LDL, not in need of statin tx.   Sugar wnl.  D/w pt.   Pt opts in for HCV screening with next set of labs.  D/w pt re: routine screening.    PMH and SH reviewed  Meds, vitals, and allergies reviewed.   ROS: See HPI.  Otherwise negative.    GEN: nad, alert and oriented HEENT: mucous membranes moist NECK: supple w/o LA CV: rrr. PULM: ctab, no inc wob ABD: soft, +bs EXT: no edema SKIN: no  acute rash but he has small irritated area on the dorsum of the R foot being addressed by derm.  I'll defer to derm clinic, patient agrees.

## 2015-07-14 ENCOUNTER — Other Ambulatory Visit (INDEPENDENT_AMBULATORY_CARE_PROVIDER_SITE_OTHER): Payer: Medicare Other

## 2015-07-14 DIAGNOSIS — Z1211 Encounter for screening for malignant neoplasm of colon: Secondary | ICD-10-CM | POA: Diagnosis not present

## 2015-07-14 LAB — FECAL OCCULT BLOOD, IMMUNOCHEMICAL: Fecal Occult Bld: NEGATIVE

## 2015-07-29 DIAGNOSIS — L570 Actinic keratosis: Secondary | ICD-10-CM | POA: Diagnosis not present

## 2015-07-29 DIAGNOSIS — B351 Tinea unguium: Secondary | ICD-10-CM | POA: Diagnosis not present

## 2015-07-29 DIAGNOSIS — L810 Postinflammatory hyperpigmentation: Secondary | ICD-10-CM | POA: Diagnosis not present

## 2015-07-29 DIAGNOSIS — D225 Melanocytic nevi of trunk: Secondary | ICD-10-CM | POA: Diagnosis not present

## 2015-07-29 DIAGNOSIS — D2262 Melanocytic nevi of left upper limb, including shoulder: Secondary | ICD-10-CM | POA: Diagnosis not present

## 2015-07-29 DIAGNOSIS — H40001 Preglaucoma, unspecified, right eye: Secondary | ICD-10-CM | POA: Diagnosis not present

## 2015-07-31 DIAGNOSIS — G4733 Obstructive sleep apnea (adult) (pediatric): Secondary | ICD-10-CM | POA: Diagnosis not present

## 2015-09-25 DIAGNOSIS — G4733 Obstructive sleep apnea (adult) (pediatric): Secondary | ICD-10-CM | POA: Diagnosis not present

## 2015-11-11 DIAGNOSIS — G4733 Obstructive sleep apnea (adult) (pediatric): Secondary | ICD-10-CM | POA: Diagnosis not present

## 2015-12-05 ENCOUNTER — Ambulatory Visit (INDEPENDENT_AMBULATORY_CARE_PROVIDER_SITE_OTHER): Payer: Medicare Other

## 2015-12-05 DIAGNOSIS — Z23 Encounter for immunization: Secondary | ICD-10-CM

## 2016-01-02 DIAGNOSIS — G4733 Obstructive sleep apnea (adult) (pediatric): Secondary | ICD-10-CM | POA: Diagnosis not present

## 2016-03-09 DIAGNOSIS — G4733 Obstructive sleep apnea (adult) (pediatric): Secondary | ICD-10-CM | POA: Diagnosis not present

## 2016-05-06 DIAGNOSIS — G4733 Obstructive sleep apnea (adult) (pediatric): Secondary | ICD-10-CM | POA: Diagnosis not present

## 2016-07-05 ENCOUNTER — Other Ambulatory Visit: Payer: Self-pay | Admitting: Family Medicine

## 2016-07-05 ENCOUNTER — Other Ambulatory Visit (INDEPENDENT_AMBULATORY_CARE_PROVIDER_SITE_OTHER): Payer: Medicare Other

## 2016-07-05 ENCOUNTER — Ambulatory Visit: Payer: Medicare Other | Admitting: Family Medicine

## 2016-07-05 DIAGNOSIS — Z125 Encounter for screening for malignant neoplasm of prostate: Secondary | ICD-10-CM

## 2016-07-05 DIAGNOSIS — E7800 Pure hypercholesterolemia, unspecified: Secondary | ICD-10-CM

## 2016-07-05 DIAGNOSIS — Z119 Encounter for screening for infectious and parasitic diseases, unspecified: Secondary | ICD-10-CM

## 2016-07-05 LAB — COMPREHENSIVE METABOLIC PANEL
ALT: 14 U/L (ref 0–53)
AST: 18 U/L (ref 0–37)
Albumin: 4.2 g/dL (ref 3.5–5.2)
Alkaline Phosphatase: 47 U/L (ref 39–117)
BUN: 16 mg/dL (ref 6–23)
CO2: 32 mEq/L (ref 19–32)
Calcium: 9.3 mg/dL (ref 8.4–10.5)
Chloride: 105 mEq/L (ref 96–112)
Creatinine, Ser: 1.04 mg/dL (ref 0.40–1.50)
GFR: 74.87 mL/min (ref >60.00)
GLUCOSE: 92 mg/dL (ref 70–99)
POTASSIUM: 4.6 meq/L (ref 3.5–5.1)
SODIUM: 141 meq/L (ref 135–145)
Total Bilirubin: 0.6 mg/dL (ref 0.2–1.2)
Total Protein: 6.6 g/dL (ref 6.0–8.3)

## 2016-07-05 LAB — LIPID PANEL
CHOLESTEROL: 150 mg/dL (ref 0–200)
HDL: 35.1 mg/dL — ABNORMAL LOW (ref >39.00)
LDL CALC: 89 mg/dL (ref 0–99)
NonHDL: 114.6
TRIGLYCERIDES: 128 mg/dL (ref 0.0–149.0)
Total CHOL/HDL Ratio: 4
VLDL: 25.6 mg/dL (ref 0.0–40.0)

## 2016-07-05 LAB — PSA, MEDICARE: PSA: 0.43 ng/mL (ref 0.10–4.00)

## 2016-07-06 LAB — HEPATITIS C ANTIBODY: HCV AB: NEGATIVE

## 2016-07-12 ENCOUNTER — Ambulatory Visit (INDEPENDENT_AMBULATORY_CARE_PROVIDER_SITE_OTHER): Payer: Medicare Other | Admitting: Family Medicine

## 2016-07-12 ENCOUNTER — Encounter: Payer: Self-pay | Admitting: Family Medicine

## 2016-07-12 VITALS — BP 138/88 | HR 58 | Temp 97.7°F | Ht 71.0 in | Wt 220.8 lb

## 2016-07-12 DIAGNOSIS — Z0000 Encounter for general adult medical examination without abnormal findings: Secondary | ICD-10-CM | POA: Diagnosis not present

## 2016-07-12 DIAGNOSIS — Z1211 Encounter for screening for malignant neoplasm of colon: Secondary | ICD-10-CM

## 2016-07-12 NOTE — Assessment & Plan Note (Signed)
Flu up to date.  Shingles up to date.  PNA up to date.  Tetanus up to date.  D/w patient ZO:XWRUEAV for colon cancer screening, including IFOB vs. colonoscopy.  Risks and benefits of both were discussed and patient voiced understanding.  Pt elects WUJ:WJXB.  Prostate cancer screening- PSA wnl, FH noted.  D/w pt.  Advance directive - wife designated if patient were incapacitated.  Cognitive function addressed- see scanned forms- and if abnormal then additional documentation follows.  Labs noted, d/w pt.  LDL wnl, not in need of statin tx.   Sugar wnl.  D/w pt.   HCV screening d/w pt.

## 2016-07-12 NOTE — Patient Instructions (Addendum)
Recheck in about 1 year.  Update me as needed in the meantime.  Go to the lab on the way out.  We'll contact you with your lab report. Take care.  Glad to see you.

## 2016-07-12 NOTE — Progress Notes (Signed)
I have personally reviewed the Medicare Annual Wellness questionnaire and have noted 1. The patient's medical and social history 2. Their use of alcohol, tobacco or illicit drugs 3. Their current medications and supplements 4. The patient's functional ability including ADL's, fall risks, home safety risks and hearing or visual             impairment. 5. Diet and physical activities 6. Evidence for depression or mood disorders  The patients weight, height, BMI have been recorded in the chart and visual acuity is per eye clinic.  I have made referrals, counseling and provided education to the patient based review of the above and I have provided the pt with a written personalized care plan for preventive services.  Provider list updated- see scanned forms.  Routine anticipatory guidance given to patient.  See health maintenance. The possibility exists that previously documented standard health maintenance information may have been brought forward from a previous encounter into this note.  If needed, that same information has been updated to reflect the current situation based on today's encounter.    Flu up to date.  Shingles up to date.  PNA up to date.  Tetanus up to date.  D/w patient WU:[id number] for colon cancer screening, including IFOB vs. colonoscopy.  Risks and benefits of both were discussed and patient voiced understanding.  Pt elects NWG:NFAO.  Prostate cancer screening- PSA wnl, FH noted.  D/w pt.  Advance directive - wife designated if patient were incapacitated.  Cognitive function addressed- see scanned forms- and if abnormal then additional documentation follows.  Labs noted, d/w pt.  LDL wnl, not in need of statin tx.   Sugar wnl.  D/w pt.   HCV screening d/w pt.   Recheck BP wnl, d/w pt.    PMH and SH reviewed  Meds, vitals, and allergies reviewed.   ROS: Per HPI.  Unless specifically indicated otherwise in HPI, the patient denies:  General: fever. Eyes: acute vision  changes ENT: sore throat Cardiovascular: chest pain Respiratory: SOB GI: vomiting GU: dysuria Musculoskeletal: acute back pain Derm: acute rash Neuro: acute motor dysfunction Psych: worsening mood Endocrine: polydipsia Heme: bleeding Allergy: hayfever  GEN: nad, alert and oriented HEENT: mucous membranes moist NECK: supple w/o LA CV: rrr. PULM: ctab, no inc wob ABD: soft, +bs EXT: no edema SKIN: no acute rash

## 2016-07-16 ENCOUNTER — Other Ambulatory Visit (INDEPENDENT_AMBULATORY_CARE_PROVIDER_SITE_OTHER): Payer: Medicare Other

## 2016-07-16 DIAGNOSIS — Z1211 Encounter for screening for malignant neoplasm of colon: Secondary | ICD-10-CM

## 2016-07-16 LAB — FECAL OCCULT BLOOD, IMMUNOCHEMICAL: FECAL OCCULT BLD: NEGATIVE

## 2016-07-19 ENCOUNTER — Encounter: Payer: Self-pay | Admitting: *Deleted

## 2016-08-05 DIAGNOSIS — G4733 Obstructive sleep apnea (adult) (pediatric): Secondary | ICD-10-CM | POA: Diagnosis not present

## 2016-08-13 ENCOUNTER — Telehealth: Payer: Self-pay | Admitting: Family Medicine

## 2016-08-13 NOTE — Telephone Encounter (Signed)
Pt spouse, Leonie DouglasMary Lou, called about a bill received for 07/05/16 DOS. The bill is for [phone]#[id number]- Hep C antibody. This was discussed while pt was at that visit but he decided not to get it at that time. Leonie DouglasMary Lou said he did not receive it and is requesting a cb to discuss. She said she called previously and spoke with someone but has not heard back.

## 2016-08-31 NOTE — Telephone Encounter (Signed)
Called and spoke to Mrs Orland Jarredroxler to advise that the [facility] is not for a shot/vaccine.  I explained that [facility] is drawn so if the pt had lab work done here at the time of his visit some of it may have been [facility].  If the pt declined a vaccine for Hepatitis [facility] would not have that info and could not bill in error for it.  A vaccine would be included in the visit he had with Dr. Para Marchuncan if it was administered. She said she would have to speak with her husband further and hung up.

## 2016-09-27 DIAGNOSIS — H43813 Vitreous degeneration, bilateral: Secondary | ICD-10-CM | POA: Diagnosis not present

## 2016-09-28 ENCOUNTER — Ambulatory Visit (INDEPENDENT_AMBULATORY_CARE_PROVIDER_SITE_OTHER): Payer: Medicare Other | Admitting: Family Medicine

## 2016-09-28 ENCOUNTER — Encounter: Payer: Self-pay | Admitting: Family Medicine

## 2016-09-28 DIAGNOSIS — H6120 Impacted cerumen, unspecified ear: Secondary | ICD-10-CM | POA: Insufficient documentation

## 2016-09-28 DIAGNOSIS — H6121 Impacted cerumen, right ear: Secondary | ICD-10-CM | POA: Diagnosis not present

## 2016-09-28 NOTE — Progress Notes (Signed)
R ear sx.  Stopped up.  Started about 3 weeks ago.  Has been swimming and he didn't know if water was in the canal. No help with otc tx for wax, etc.  No FCNAVD.  He doesn't feel unwell.  No ear pain.    Meds, vitals, and allergies reviewed.   ROS: Per HPI unless specifically indicated in ROS section   nad ncat L TM wnl R canal with cerumen removed with irrigation and curette.  Canal with mild irritation but wnl o/w.  TM wnl.  He felt better.  Hearing testing grossly wnl after treatment. Weber testing symmetric after treatment, d/w pt.

## 2016-09-28 NOTE — Assessment & Plan Note (Signed)
Resolved, f/u prn.  D/w pt.  He agrees.

## 2016-09-28 NOTE — Patient Instructions (Signed)
Take care.  Glad to see you.  

## 2016-10-11 DIAGNOSIS — H6121 Impacted cerumen, right ear: Secondary | ICD-10-CM | POA: Diagnosis not present

## 2016-10-11 DIAGNOSIS — H60331 Swimmer's ear, right ear: Secondary | ICD-10-CM | POA: Diagnosis not present

## 2016-10-20 DIAGNOSIS — H60331 Swimmer's ear, right ear: Secondary | ICD-10-CM | POA: Diagnosis not present

## 2016-10-20 DIAGNOSIS — H903 Sensorineural hearing loss, bilateral: Secondary | ICD-10-CM | POA: Diagnosis not present

## 2016-10-28 DIAGNOSIS — G4733 Obstructive sleep apnea (adult) (pediatric): Secondary | ICD-10-CM | POA: Diagnosis not present

## 2016-12-28 DIAGNOSIS — G4733 Obstructive sleep apnea (adult) (pediatric): Secondary | ICD-10-CM | POA: Diagnosis not present

## 2017-01-13 ENCOUNTER — Encounter: Payer: Self-pay | Admitting: Family Medicine

## 2017-02-10 ENCOUNTER — Ambulatory Visit: Payer: Self-pay | Admitting: *Deleted

## 2017-02-10 NOTE — Telephone Encounter (Signed)
160/90 normal 163/93 this morning  Bought new cuff-upper left arm  During day patient does get flushed and hot in his face. He does not have chest pain. Patient is very active.  Reason for Disposition . Systolic BP  >= 160 OR Diastolic >= 100  Answer Assessment - Initial Assessment Questions 1. BLOOD PRESSURE: "What is the blood pressure?" "Did you take at least two measurements 5 minutes apart?"     163/93 6:30 slightly higher last night 160/96 2. ONSET: "When did you take your blood pressure?"     1 week with new cuff- 2-3 times daily 3. HOW: "How did you obtain the blood pressure?" (e.g., visiting nurse, automatic home BP monitor)     New cuff- upper left arm 4. HISTORY: "Do you have a history of high blood pressure?"     No medication at this time- no heart problems 5. MEDICATIONS: "Are you taking any medications for blood pressure?" "Have you missed any doses recently?"     No- medications 6. OTHER SYMPTOMS: "Do you have any symptoms?" (e.g., headache, chest pain, blurred vision, difficulty breathing, weakness)     no 7. PREGNANCY: "Is there any chance you are pregnant?" "When was your last menstrual period?"     n/a  Protocols used: HIGH BLOOD PRESSURE-A-AH

## 2017-02-10 NOTE — Telephone Encounter (Signed)
Pt scheduled for 11/9

## 2017-02-11 ENCOUNTER — Ambulatory Visit: Payer: Medicare Other | Admitting: Family Medicine

## 2017-02-11 ENCOUNTER — Encounter: Payer: Self-pay | Admitting: Family Medicine

## 2017-02-11 DIAGNOSIS — R030 Elevated blood-pressure reading, without diagnosis of hypertension: Secondary | ICD-10-CM

## 2017-02-11 NOTE — Patient Instructions (Signed)
Your BP is okay here.  Update me as needed.  Take care.  Glad to see you.

## 2017-02-11 NOTE — Progress Notes (Signed)
Wife was seen at cardiology clinic.  She had been checking her BP at home and it was elevated.  He questioned if the reading was correct.  He checked his BP as a reference and it was elevated at home, on the initial cuff.    He got another cuff to compare and his BP was still high, with DBP still > 90 on the second cuff.  Today was the 1st time he has had a DBP <90 on the home cuff.    Recheck BP here today on our cuff is normal.  No CP, SOB, BLE edema except for some L sided edema due to L knee pain. Not lightheaded.    He has episodic vertigo at baseline but isn't lightheaded.  He has some facial flushing but not in the last few days.    Salt intake is minimal, as little as he can.  He is still exercising.    Meds, vitals, and allergies reviewed.   ROS: Per HPI unless specifically indicated in ROS section   GEN: nad, alert and oriented HEENT: mucous membranes moist NECK: supple w/o LA CV: rrr.  PULM: ctab, no inc wob ABD: soft, +bs EXT: no edema  Recheck blood pressure 122/74 on the right arm.  128/78 on the left arm.

## 2017-02-13 DIAGNOSIS — I10 Essential (primary) hypertension: Secondary | ICD-10-CM | POA: Insufficient documentation

## 2017-02-13 NOTE — Assessment & Plan Note (Addendum)
Without diagnosis of hypertension. Recheck blood pressure 122/74 on the right arm.  128/78 on the left arm. Discussed with patient about options.  He will monitor.  It could be that his cuff was reading artificially high.  It could have been the strain of recent events affecting his pressure (wife was ill).  He will update me as needed.  Okay for outpatient follow-up.

## 2017-02-21 DIAGNOSIS — E669 Obesity, unspecified: Secondary | ICD-10-CM | POA: Diagnosis not present

## 2017-02-21 DIAGNOSIS — G4733 Obstructive sleep apnea (adult) (pediatric): Secondary | ICD-10-CM | POA: Diagnosis not present

## 2017-02-23 ENCOUNTER — Encounter: Payer: Self-pay | Admitting: Emergency Medicine

## 2017-02-23 ENCOUNTER — Emergency Department
Admission: EM | Admit: 2017-02-23 | Discharge: 2017-02-23 | Disposition: A | Discharge status: Home / Self Care | Payer: Medicare Other | Attending: Emergency Medicine | Admitting: Emergency Medicine

## 2017-02-23 ENCOUNTER — Ambulatory Visit: Payer: Self-pay | Admitting: *Deleted

## 2017-02-23 DIAGNOSIS — I10 Essential (primary) hypertension: Secondary | ICD-10-CM | POA: Diagnosis not present

## 2017-02-23 LAB — CBC WITH DIFFERENTIAL/PLATELET
Basophils Absolute: 0.1 10*3/uL (ref 0–0.1)
Basophils Relative: 1 %
EOS ABS: 0.3 10*3/uL (ref 0–0.7)
Eosinophils Relative: 4 %
HCT: 45.7 % (ref 40.0–52.0)
HEMOGLOBIN: 15.6 g/dL (ref 13.0–18.0)
LYMPHS ABS: 1.8 10*3/uL (ref 1.0–3.6)
LYMPHS PCT: 23 %
MCH: 30.4 pg (ref 26.0–34.0)
MCHC: 34.2 g/dL (ref 32.0–36.0)
MCV: 89 fL (ref 80.0–100.0)
Monocytes Absolute: 0.9 10*3/uL (ref 0.2–1.0)
Monocytes Relative: 11 %
NEUTROS PCT: 61 %
Neutro Abs: 4.8 10*3/uL (ref 1.4–6.5)
Platelets: 218 10*3/uL (ref 150–440)
RBC: 5.14 MIL/uL (ref 4.40–5.90)
RDW: 12.9 % (ref 11.5–14.5)
WBC: 7.9 10*3/uL (ref 3.8–10.6)

## 2017-02-23 LAB — BASIC METABOLIC PANEL
Anion gap: 9 (ref 5–15)
BUN: 20 mg/dL (ref 6–20)
CHLORIDE: 103 mmol/L (ref 101–111)
CO2: 28 mmol/L (ref 22–32)
CREATININE: 1.06 mg/dL (ref 0.61–1.24)
Calcium: 9.3 mg/dL (ref 8.9–10.3)
GFR calc non Af Amer: >60 mL/min (ref >60)
Glucose, Bld: 89 mg/dL (ref 65–99)
POTASSIUM: 4.4 mmol/L (ref 3.5–5.1)
SODIUM: 140 mmol/L (ref 135–145)

## 2017-02-23 LAB — TROPONIN I

## 2017-02-23 MED ORDER — AMLODIPINE BESYLATE 5 MG PO TABS
10.0000 mg | ORAL_TABLET | Freq: Once | ORAL | Status: AC
  Administered 2017-02-23: 10 mg via ORAL
  Filled 2017-02-23: qty 2

## 2017-02-23 MED ORDER — AMLODIPINE BESYLATE 5 MG PO TABS: 5.0000 mg | ORAL_TABLET | Freq: Every day | ORAL | 0 refills | Status: DC

## 2017-02-23 NOTE — Telephone Encounter (Signed)
Yesterday 183/102 at pharmacy and remaining in the 170/95 range today using BP cuff that his wife had (electronic cuff used on both arms; values remain consistent; last saw Dr Para Marchuncan on 02/11/17 and told to follow up; pt also states that he gets "hot and flushed in the face when this happens"; pt encouraged to go to ED per nurse triage but he declines; offered pt appt with a different provider but pt would like to see Dr Para Marchuncan at his next available appt;pt further states that he does not feel like  He needs to be seen in ED at this time; pt also says he will go to ED if he was getting bad symptoms or if BP is extremely high when he checks it; pt "feels that his symptoms are not compatible with BP readings"; pt offered and accepted appt with Dr Para Marchuncan on 02/21/17 at 1230, and verbalizes undesrtanding; [facility] and upcoming appt; pt can be contacted at [phone]   Reason for Disposition . [1] Systolic BP  >= 160 OR Diastolic >= 100 AND [2] cardiac or neurologic symptoms (e.g., chest pain, difficulty breathing, unsteady gait, blurred vision)  Answer Assessment - Initial Assessment Questions 1. BLOOD PRESSURE: "What is the blood pressure?" "Did you take at least two measurements 5 minutes apart?"     yes 2. ONSET: "When did you take your blood pressure?"     Last taken at 1145 today 02/23/17 3. HOW: "How did you obtain the blood pressure?" (e.g., visiting nurse, automatic home BP monitor)     Electronic cuff on both arms 4. HISTORY: "Do you have a history of high blood pressure?"     no 5. MEDICATIONS: "Are you taking any medications for blood pressure?" "Have you missed any doses recently?"     no 6. OTHER SYMPTOMS: "Do you have any symptoms?" (e.g., headache, chest pain, blurred vision, difficulty breathing, weakness)     Occasional headaches and constriction/pressure around sides of neck, and, tightness chest last night  7. PREGNANCY: "Is there  any chance you are pregnant?" "When was your last menstrual period?"     no  Protocols used: HIGH BLOOD PRESSURE-A-AH

## 2017-02-23 NOTE — ED Notes (Signed)
Pt reports elevated blood pressures for 2-3 weeks.    Pt has a headache.  No slurred speech.  No diff ambulating.   No hx of htn.  No chest pain.  No sob.  Pt's skin is flushed.  Sinus on monitor at 63.

## 2017-02-23 NOTE — Telephone Encounter (Signed)
Spoke to pt and advised that due to his consistent elevated BP, it is in his best interest to go directly to the ED or UC. Pt states that he has a BP monitor at home and will "watch it for a few hours," to see if it may decrease. He states should it remain elevated he will go to ED. Offered 11/16 appt, but pt refused as he is scheduled to be out of town.

## 2017-02-23 NOTE — ED Triage Notes (Signed)
Pt comes into the ED via POV c/o hypertension.  Patient states his BP was 187/105 earlier today.  The family has been tracking the trend of his BP.  Patient called his PCP and they instructed him not to go out of town, but instead come to the ED if his BP was that high.  Patient is asymptomatic except headaches.  Denies any N/V, chest pain, shortness of breath, or dizziness.  Patient in NAD with even and unlabored respirations.

## 2017-02-23 NOTE — Discharge Instructions (Signed)
Turn to the ER immediately for persistent high blood pressure, blood pressure higher than 200 on the top number or 120 on the bottom number, chest pain, neck pain, difficulty breathing, severe headache, confusion, weakness, or any other new or worsening symptoms that concern you.    Take the amlodipine as prescribed until you follow-up with your doctor.  We have provided a 2-week supply, so you will need to follow-up with your doctor before it ends to discuss whether to continue it or to change to another medication.

## 2017-02-23 NOTE — Telephone Encounter (Signed)
Noted. Thanks.

## 2017-02-23 NOTE — ED Provider Notes (Addendum)
[facility] Emergency Department Provider Note ____________________________________________   First MD Initiated Contact with Patient 02/23/17 1804     (approximate)  I have reviewed the triage vital signs and the nursing notes.   HISTORY  Chief Complaint Hypertension    HPI Kevaughn D Kasprzak is a 71 y.o. male with a history of BPH who presents with hypertension over the last several weeks, measured to approximately 180/100 intermittently, and associated with a feeling of being flushed in his face, and sometimes bilateral tightness in the neck.  Patient denies associated chest pain, difficulty breathing, severe headache, confusion or change in mental status, or other acute symptoms.  Patient states that he initially felt a flushed feeling and used his wife's blood pressure monitor to check his blood pressure and found it to be elevated, however when he went to his primary care doctor last week it was normal.  Since then he has been checking it more frequently with a different monitor, and it continues to be abnormal.   Past Medical History:  Diagnosis Date  . BPH (benign prostatic hyperplasia)     Patient Active Problem List   Diagnosis Date Noted  . Elevated blood pressure reading 02/13/2017  . Cerumen impaction 09/28/2016  . Advance care planning 07/04/2014  . Medicare annual wellness visit, subsequent 06/26/2013  . FH: prostate cancer 06/26/2013  . Knee pain, bilateral 04/01/2011  . Hypercholesteremia 03/22/2011  . DIZZINESS, CHRONIC 03/26/2010  . BENIGN PROSTATIC HYPERTROPHY 03/09/2007  . ECZEMA 03/09/2007    Past Surgical History:  Procedure Laterality Date  . Left knee surgery  05/2009   L med meniscal repair (Dr. Thomasena Edisollins)  . rectal fistula plug  01/02/07   [facility] (Dr. Lolita PatellaManth)  . SHOULDER SURGERY  2013   L rotator cuff repair    Prior to Admission medications   Medication Sig Start Date End Date Taking? Authorizing Provider  amLODipine  (NORVASC) 5 MG tablet Take 1 tablet (5 mg total) by mouth daily for 15 days. 02/24/17 03/11/17  Dionne BucySiadecki, Katlin Bortner, MD    Allergies Patient has no known allergies.  Family History  Problem Relation Age of Onset  . Hypothyroidism Mother   . Heart disease Mother        CHF, MI  . Cancer Father        prostate, lung  . Prostate cancer Father   . Hypertension Sister   . Stroke Maternal Grandfather   . Colon cancer Neg Hx     Social History Social History   Tobacco Use  . Smoking status: Never Smoker  . Smokeless tobacco: Never Used  Substance Use Topics  . Alcohol use: No    Alcohol/week: 0.0 oz  . Drug use: No    Review of Systems  Constitutional: No fever/chills Eyes: No visual changes. ENT: Positive for bilateral tightness in the neck. Cardiovascular: Denies chest pain. Respiratory: Denies shortness of breath. Gastrointestinal: No nausea, no vomiting.  No diarrhea.  Genitourinary: Negative for dysuria.  Musculoskeletal: Negative for back pain. Skin: Negative for rash. Neurological: Negative for severe headaches (patient reports very mild one), focal weakness or numbness.   ____________________________________________   PHYSICAL EXAM:  VITAL SIGNS: ED Triage Vitals [02/23/17 1719]  Enc Vitals Group     BP (!) 190/96     Pulse Rate 66     Resp 17     Temp 98 F (36.7 C)     Temp Source Oral     SpO2 99 %  Weight 215 lb (97.5 kg)     Height 5\' 11"  (1.803 m)     Head Circumference      Peak Flow      Pain Score 1     Pain Loc      Pain Edu?      Excl. in GC?     Constitutional: Alert and oriented. Well appearing and in no acute distress. Eyes: Conjunctivae are normal.  EOMI.  Head: Atraumatic. Nose: No congestion/rhinnorhea. Mouth/Throat: Mucous membranes are moist.   Neck: Normal range of motion.  No neck swelling or tenderness.  No thrills or bruit. Cardiovascular: Normal rate, regular rhythm. Grossly normal heart sounds.  Good peripheral  circulation. Respiratory: Normal respiratory effort.  No retractions. Lungs CTAB. Gastrointestinal: Soft and nontender. No distention.  Genitourinary: No CVA tenderness. Musculoskeletal: No lower extremity edema.  Extremities warm and well perfused.  Neurologic:  Normal speech and language. No gross focal neurologic deficits are appreciated.  Skin:  Skin is warm and dry. No rash noted. Psychiatric: Mood and affect are normal. Speech and behavior are normal.  ____________________________________________   LABS (all labs ordered are listed, but only abnormal results are displayed)  Labs Reviewed  BASIC METABOLIC PANEL  CBC WITH DIFFERENTIAL/PLATELET  TROPONIN I   ____________________________________________  EKG  ED ECG REPORT I, Dionne BucySebastian Cassondra Stachowski, the attending physician, personally viewed and interpreted this ECG.  Date: 02/23/2017 EKG Time: 1828 Rate: 60 Rhythm: normal sinus rhythm QRS Axis: normal Intervals: normal ST/T Wave abnormalities: normal Narrative Interpretation: no evidence of acute ischemia  ____________________________________________  RADIOLOGY    ____________________________________________   PROCEDURES  Procedure(s) performed: No    Critical Care performed: No ____________________________________________   INITIAL IMPRESSION / ASSESSMENT AND PLAN / ED COURSE  Pertinent labs & imaging results that were available during my care of the patient were reviewed by me and considered in my medical decision making (see chart for details).  71 year old male with no significant PMH except for BPH presents with elevated blood pressure over the last few weeks, associated with a flushed feeling in his face, sometimes mild headache, and sometimes tightness in his neck, but no chest pain, difficulty breathing, or other symptoms.  Review of past medical records in Epic is noncontributory.  On exam, vital signs are normal except for elevated blood  pressure, and the remainder of the exam is unremarkable.  Patient is well-appearing.  Overall suspect chronic hypertension; there is no evidence of hypertension emergency or end organ damage.  Plan: P.o. antihypertensive, basic labs, screening troponin x1 and EKG.  If no evidence of endorgan damage and blood pressure improved or stable, likely discharge home with antihypertensive and close PMD follow-up.      ____________________________________________   FINAL CLINICAL IMPRESSION(S) / ED DIAGNOSES  Final diagnoses:  Hypertension, unspecified type      NEW MEDICATIONS STARTED DURING THIS VISIT:  This SmartLink is deprecated. Use AVSMEDLIST instead to display the medication list for a patient.   Note:  This document was prepared using Dragon voice recognition software and may include unintentional dictation errors.    Dionne BucySiadecki, Imri Lor, MD 02/23/17 [id number]    Dionne BucySiadecki, Dalana Pfahler, MD 02/23/17 2025

## 2017-03-03 ENCOUNTER — Ambulatory Visit: Payer: Medicare Other | Admitting: Family Medicine

## 2017-03-03 DIAGNOSIS — I10 Essential (primary) hypertension: Secondary | ICD-10-CM

## 2017-03-03 MED ORDER — AMLODIPINE BESYLATE 5 MG PO TABS: 7.5000 mg | ORAL_TABLET | Freq: Every day | ORAL | 3 refills | Status: DC

## 2017-03-03 NOTE — Patient Instructions (Signed)
Take 1.5 tabs of amlodipine for about 1 week.  If BP is consistently >140/>90 after that, then go up to 2 tabs a day.  If you have a lot more swelling, then cut back on the dose and update me.  I'd like to hear about your BP in about 2-3 weeks.  Take care.  Glad to see you.

## 2017-03-03 NOTE — Progress Notes (Signed)
Had BP that was okay at OV here but worse after the visit, in the following days.  His facial flushing returned.  ER f/u from 02/23/17 with elevated BP.  He was getting BPs up to ~190 systolic. EKG unremarkable, reviewed. Troponin neg.  D/w pt. Usually 140s/80s in the meantime. No BLE edema.  No CP, no SOB.  He does have a more prominent vein near the R achilles since starting the medicine. Not lightheaded.    Meds, vitals, and allergies reviewed.   ROS: Per HPI unless specifically indicated in ROS section   GEN: nad, alert and oriented HEENT: mucous membranes moist NECK: supple w/o LA CV: rrr PULM: ctab, no inc wob ABD: soft, +bs EXT: no edema Prominent vein noted near R achilles but not ttp or red.

## 2017-03-04 NOTE — Assessment & Plan Note (Signed)
Take 1.5 tabs of amlodipine for about 1 week.  If BP is consistently >140/>90 after that, then go up to 2 tabs a day.  If having a lot more swelling, then cut back on the dose and update me.  I'd like to hear about his BP in about 2-3 weeks. He agrees.

## 2017-03-07 DIAGNOSIS — G4733 Obstructive sleep apnea (adult) (pediatric): Secondary | ICD-10-CM | POA: Diagnosis not present

## 2017-05-09 DIAGNOSIS — G4733 Obstructive sleep apnea (adult) (pediatric): Secondary | ICD-10-CM | POA: Diagnosis not present

## 2017-06-21 DIAGNOSIS — G4733 Obstructive sleep apnea (adult) (pediatric): Secondary | ICD-10-CM | POA: Diagnosis not present

## 2017-08-10 ENCOUNTER — Encounter: Payer: Self-pay | Admitting: Family Medicine

## 2017-08-25 DIAGNOSIS — G4733 Obstructive sleep apnea (adult) (pediatric): Secondary | ICD-10-CM | POA: Diagnosis not present

## 2017-08-30 ENCOUNTER — Other Ambulatory Visit (INDEPENDENT_AMBULATORY_CARE_PROVIDER_SITE_OTHER): Payer: Medicare Other

## 2017-08-30 ENCOUNTER — Other Ambulatory Visit: Payer: Self-pay | Admitting: Family Medicine

## 2017-08-30 DIAGNOSIS — Z125 Encounter for screening for malignant neoplasm of prostate: Secondary | ICD-10-CM | POA: Diagnosis not present

## 2017-08-30 DIAGNOSIS — I10 Essential (primary) hypertension: Secondary | ICD-10-CM

## 2017-08-30 LAB — COMPREHENSIVE METABOLIC PANEL
ALT: 14 U/L (ref 0–53)
AST: 18 U/L (ref 0–37)
Albumin: 4.3 g/dL (ref 3.5–5.2)
Alkaline Phosphatase: 52 U/L (ref 39–117)
BUN: 17 mg/dL (ref 6–23)
CHLORIDE: 103 meq/L (ref 96–112)
CO2: 31 mEq/L (ref 19–32)
Calcium: 9.5 mg/dL (ref 8.4–10.5)
Creatinine, Ser: 0.99 mg/dL (ref 0.40–1.50)
GFR: 78.99 mL/min (ref >60.00)
GLUCOSE: 98 mg/dL (ref 70–99)
POTASSIUM: 4.6 meq/L (ref 3.5–5.1)
SODIUM: 140 meq/L (ref 135–145)
Total Bilirubin: 0.8 mg/dL (ref 0.2–1.2)
Total Protein: 7 g/dL (ref 6.0–8.3)

## 2017-08-30 LAB — LIPID PANEL
Cholesterol: 173 mg/dL (ref 0–200)
HDL: 43.5 mg/dL (ref >39.00)
LDL CALC: 111 mg/dL — AB (ref 0–99)
NONHDL: 129.89
Total CHOL/HDL Ratio: 4
Triglycerides: 94 mg/dL (ref 0.0–149.0)
VLDL: 18.8 mg/dL (ref 0.0–40.0)

## 2017-08-30 LAB — PSA, MEDICARE: PSA: 0.57 ng/mL (ref 0.10–4.00)

## 2017-09-01 ENCOUNTER — Ambulatory Visit (INDEPENDENT_AMBULATORY_CARE_PROVIDER_SITE_OTHER): Payer: Medicare Other | Admitting: Family Medicine

## 2017-09-01 ENCOUNTER — Encounter: Payer: Self-pay | Admitting: Family Medicine

## 2017-09-01 VITALS — BP 140/82 | HR 64 | Temp 98.2°F | Ht 70.0 in | Wt 219.0 lb

## 2017-09-01 DIAGNOSIS — I10 Essential (primary) hypertension: Secondary | ICD-10-CM

## 2017-09-01 DIAGNOSIS — R42 Dizziness and giddiness: Secondary | ICD-10-CM

## 2017-09-01 DIAGNOSIS — Z7189 Other specified counseling: Secondary | ICD-10-CM

## 2017-09-01 DIAGNOSIS — Z0000 Encounter for general adult medical examination without abnormal findings: Secondary | ICD-10-CM

## 2017-09-01 MED ORDER — AMLODIPINE BESYLATE 5 MG PO TABS: 5.0000 mg | ORAL_TABLET | Freq: Every day | ORAL | Status: DC

## 2017-09-01 NOTE — Patient Instructions (Signed)
We'll get cologuard set up for you.  It'll be sent to your house.   Take care.  Glad to see you.

## 2017-09-01 NOTE — Progress Notes (Signed)
I have personally reviewed the Medicare Annual Wellness questionnaire and have noted 1. The patient's medical and social history 2. Their use of alcohol, tobacco or illicit drugs 3. Their current medications and supplements 4. The patient's functional ability including ADL's, fall risks, home safety risks and hearing or visual             impairment. 5. Diet and physical activities 6. Evidence for depression or mood disorders  The patients weight, height, BMI have been recorded in the chart and visual acuity is per eye clinic.  I have made referrals, counseling and provided education to the patient based review of the above and I have provided the pt with a written personalized care plan for preventive services.  Provider list updated- see scanned forms.  Routine anticipatory guidance given to patient.  See health maintenance. The possibility exists that previously documented standard health maintenance information may have been brought forward from a previous encounter into this note.  If needed, that same information has been updated to reflect the current situation based on today's encounter.    Flu 2018 Shingles 2014 PNA UTD Tetanus 2013 D/w patient [id number] for colon cancer screening, including IFOB vs. colonoscopy.  Risks and benefits of both were discussed and patient voiced understanding.  Pt elects [id number] Prostate cancer screening- 2019, d/w pt.  Advance directive-wife designated if patient were incapacitated. Cognitive function addressed- see scanned forms- and if abnormal then additional documentation follows.   Hypertension:    Using medication without problems or lightheadedness: yes but see below re: imbalance. Chest pain with exertion:no Edema: some, intermittent, less on  amlodipine.  Short of breath:no  H/o vertigo.  Sx started about 8-9 years ago.  He has occ return of sx in the meantime.  He was told he didn't have BPPV.  He was told he had vestibulitis due  to a viral infection.  Some days he has sx, more than others, with the worst sx with weather changes. No falls.  He was told that dancing was likely helpful with sx.    PMH and SH reviewed  Meds, vitals, and allergies reviewed.   ROS: Per HPI.  Unless specifically indicated otherwise in HPI, the patient denies:  General: fever. Eyes: acute vision changes ENT: sore throat Cardiovascular: chest pain Respiratory: SOB GI: vomiting GU: dysuria Musculoskeletal: acute back pain Derm: acute rash Neuro: acute motor dysfunction Psych: worsening mood Endocrine: polydipsia Heme: bleeding Allergy: hayfever  GEN: nad, alert and oriented HEENT: mucous membranes moist NECK: supple w/o LA CV: rrr. PULM: ctab, no inc wob ABD: soft, +bs EXT: no edema SKIN: no acute rash

## 2017-09-04 NOTE — Assessment & Plan Note (Signed)
Advance directive- wife designated if patient were incapacitated.  

## 2017-09-04 NOTE — Assessment & Plan Note (Signed)
With lower extremity swelling improved on 5 mg amlodipine.  He had more swelling at higher dose.  Continue as is for now.  Labs discussed with patient.  He agrees.

## 2017-09-04 NOTE — Assessment & Plan Note (Signed)
Flu 2018 Shingles 2014 PNA UTD Tetanus 2013 D/w patient [id number]:options for colon cancer screening, including IFOB vs. colonoscopy.  Risks and benefits of both were discussed and patient voiced understanding.  Pt elects [id number]:cologuard Prostate cancer screening- 2019, d/w pt.  Advance directive-wife designated if patient were incapacitated. Cognitive function addressed- see scanned forms- and if abnormal then additional documentation follows.

## 2017-09-04 NOTE — Assessment & Plan Note (Signed)
Sx started about 2010 ago.  He has occ return of sx in the meantime.  He was told he didn't have BPPV.  He was told he had vestibulitis due to a viral infection.  Some days he has sx, more than others, with the worst sx with weather changes. No falls.  He was told that dancing was likely helpful with sx. he puts up with his symptoms as is.  He had prev ENT eval.  No recent change in sx or situation.

## 2017-10-02 ENCOUNTER — Encounter: Payer: Self-pay | Admitting: Family Medicine

## 2017-10-03 DIAGNOSIS — G4733 Obstructive sleep apnea (adult) (pediatric): Secondary | ICD-10-CM | POA: Diagnosis not present

## 2017-10-05 DIAGNOSIS — Z0000 Encounter for general adult medical examination without abnormal findings: Secondary | ICD-10-CM | POA: Diagnosis not present

## 2017-10-11 DIAGNOSIS — Z1212 Encounter for screening for malignant neoplasm of rectum: Secondary | ICD-10-CM | POA: Diagnosis not present

## 2017-10-11 DIAGNOSIS — Z1211 Encounter for screening for malignant neoplasm of colon: Secondary | ICD-10-CM | POA: Diagnosis not present

## 2017-10-12 LAB — IFOBT (OCCULT BLOOD): IMMUNOLOGICAL FECAL OCCULT BLOOD TEST: NEGATIVE

## 2017-10-20 LAB — COLOGUARD: COLOGUARD: NEGATIVE

## 2017-10-28 ENCOUNTER — Encounter: Payer: Self-pay | Admitting: Family Medicine

## 2017-11-04 DIAGNOSIS — G4733 Obstructive sleep apnea (adult) (pediatric): Secondary | ICD-10-CM | POA: Diagnosis not present

## 2017-12-08 DIAGNOSIS — G4733 Obstructive sleep apnea (adult) (pediatric): Secondary | ICD-10-CM | POA: Diagnosis not present

## 2017-12-29 ENCOUNTER — Ambulatory Visit: Payer: Medicare Other | Admitting: Family Medicine

## 2017-12-29 ENCOUNTER — Encounter: Payer: Self-pay | Admitting: Family Medicine

## 2017-12-29 VITALS — BP 126/80 | HR 68 | Temp 98.5°F | Ht 70.0 in | Wt 220.5 lb

## 2017-12-29 DIAGNOSIS — Z23 Encounter for immunization: Secondary | ICD-10-CM

## 2017-12-29 DIAGNOSIS — M533 Sacrococcygeal disorders, not elsewhere classified: Secondary | ICD-10-CM | POA: Diagnosis not present

## 2017-12-29 NOTE — Patient Instructions (Signed)
This looks like sacral irritation.  This doesn't look ominous.   Try to avoid the positions with exercise that exacerbate it.   The doesn't look infected but you have irritated your tailbone.   Take care.  Glad to see you.

## 2017-12-29 NOTE — Progress Notes (Signed)
Tender spot near the tailbone.  Sore to the touch.  [facility], when [facility] backward or forward while getting on or off an exercise machine..  Going on for a "while, about 6 weeks."  Not getting better.  No drainage.  No FCNAVD.   No other specific trauma.  Meds, vitals, and allergies reviewed.   ROS: Per HPI unless specifically indicated in ROS section   nad Lower L spine back not ttp No pilonidal cyst.  Not associated with the prev fistula surgery-this is not tender and appears well-healed.   He has tenderness at the distal coccyx without erythema bruising rash or ulceration.

## 2017-12-30 DIAGNOSIS — M533 Sacrococcygeal disorders, not elsewhere classified: Secondary | ICD-10-CM | POA: Insufficient documentation

## 2017-12-30 NOTE — Assessment & Plan Note (Signed)
This should gradually get better.  No sign of pilonidal cyst.  Does not appear to involve previous fistula.  Likely has irritated the coccyx.  Episodically irritated with getting on or off machines at the [facility].  Discussed with patient about trigger avoidance.  I would expect this to gradually improve.  Update me as needed.  Imaging is likely of low yield today.  Discussed.  He agrees.

## 2018-02-13 DIAGNOSIS — G4733 Obstructive sleep apnea (adult) (pediatric): Secondary | ICD-10-CM | POA: Diagnosis not present

## 2018-02-13 DIAGNOSIS — E669 Obesity, unspecified: Secondary | ICD-10-CM | POA: Diagnosis not present

## 2018-02-13 DIAGNOSIS — I10 Essential (primary) hypertension: Secondary | ICD-10-CM | POA: Diagnosis not present

## 2018-02-18 DIAGNOSIS — S0180XA Unspecified open wound of other part of head, initial encounter: Secondary | ICD-10-CM | POA: Diagnosis not present

## 2018-02-28 DIAGNOSIS — G4733 Obstructive sleep apnea (adult) (pediatric): Secondary | ICD-10-CM | POA: Diagnosis not present

## 2018-03-13 DIAGNOSIS — H6121 Impacted cerumen, right ear: Secondary | ICD-10-CM | POA: Diagnosis not present

## 2018-03-13 DIAGNOSIS — H903 Sensorineural hearing loss, bilateral: Secondary | ICD-10-CM | POA: Diagnosis not present

## 2018-03-15 DIAGNOSIS — G4733 Obstructive sleep apnea (adult) (pediatric): Secondary | ICD-10-CM | POA: Diagnosis not present

## 2018-04-10 DIAGNOSIS — H2513 Age-related nuclear cataract, bilateral: Secondary | ICD-10-CM | POA: Diagnosis not present

## 2018-05-16 ENCOUNTER — Other Ambulatory Visit: Payer: Self-pay | Admitting: Family Medicine

## 2018-06-14 DIAGNOSIS — G4733 Obstructive sleep apnea (adult) (pediatric): Secondary | ICD-10-CM | POA: Diagnosis not present

## 2018-09-14 DIAGNOSIS — G4733 Obstructive sleep apnea (adult) (pediatric): Secondary | ICD-10-CM | POA: Diagnosis not present

## 2018-12-06 ENCOUNTER — Encounter: Payer: Self-pay | Admitting: Family Medicine

## 2018-12-07 ENCOUNTER — Ambulatory Visit (INDEPENDENT_AMBULATORY_CARE_PROVIDER_SITE_OTHER): Payer: Medicare Other

## 2018-12-07 DIAGNOSIS — Z23 Encounter for immunization: Secondary | ICD-10-CM | POA: Diagnosis not present

## 2018-12-15 DIAGNOSIS — G4733 Obstructive sleep apnea (adult) (pediatric): Secondary | ICD-10-CM | POA: Diagnosis not present

## 2019-01-15 DIAGNOSIS — H6121 Impacted cerumen, right ear: Secondary | ICD-10-CM | POA: Diagnosis not present

## 2019-01-15 DIAGNOSIS — H60331 Swimmer's ear, right ear: Secondary | ICD-10-CM | POA: Diagnosis not present

## 2019-01-16 ENCOUNTER — Other Ambulatory Visit: Payer: Self-pay

## 2019-01-16 ENCOUNTER — Other Ambulatory Visit (INDEPENDENT_AMBULATORY_CARE_PROVIDER_SITE_OTHER): Payer: Medicare Other

## 2019-01-16 ENCOUNTER — Other Ambulatory Visit: Payer: Self-pay | Admitting: Family Medicine

## 2019-01-16 DIAGNOSIS — Z125 Encounter for screening for malignant neoplasm of prostate: Secondary | ICD-10-CM

## 2019-01-16 DIAGNOSIS — I10 Essential (primary) hypertension: Secondary | ICD-10-CM

## 2019-01-16 LAB — LIPID PANEL
Cholesterol: 173 mg/dL (ref 0–200)
HDL: 39.4 mg/dL (ref >39.00)
LDL Cholesterol: 105 mg/dL — ABNORMAL HIGH (ref 0–99)
NonHDL: 133.87
Total CHOL/HDL Ratio: 4
Triglycerides: 144 mg/dL (ref 0.0–149.0)
VLDL: 28.8 mg/dL (ref 0.0–40.0)

## 2019-01-16 LAB — COMPREHENSIVE METABOLIC PANEL
ALT: 14 U/L (ref 0–53)
AST: 17 U/L (ref 0–37)
Albumin: 4.3 g/dL (ref 3.5–5.2)
Alkaline Phosphatase: 57 U/L (ref 39–117)
BUN: 17 mg/dL (ref 6–23)
CO2: 29 mEq/L (ref 19–32)
Calcium: 9.8 mg/dL (ref 8.4–10.5)
Chloride: 103 mEq/L (ref 96–112)
Creatinine, Ser: 1.12 mg/dL (ref 0.40–1.50)
GFR: 64.2 mL/min (ref >60.00)
Glucose, Bld: 95 mg/dL (ref 70–99)
Potassium: 4.5 mEq/L (ref 3.5–5.1)
Sodium: 140 mEq/L (ref 135–145)
Total Bilirubin: 0.8 mg/dL (ref 0.2–1.2)
Total Protein: 6.7 g/dL (ref 6.0–8.3)

## 2019-01-16 LAB — PSA, MEDICARE: PSA: 0.47 ng/ml (ref 0.10–4.00)

## 2019-01-18 ENCOUNTER — Encounter: Payer: Self-pay | Admitting: Family Medicine

## 2019-01-18 ENCOUNTER — Other Ambulatory Visit: Payer: Self-pay

## 2019-01-18 ENCOUNTER — Ambulatory Visit (INDEPENDENT_AMBULATORY_CARE_PROVIDER_SITE_OTHER): Payer: Medicare Other | Admitting: Family Medicine

## 2019-01-18 VITALS — BP 122/80 | HR 65 | Temp 98.0°F | Ht 70.0 in | Wt 222.0 lb

## 2019-01-18 DIAGNOSIS — Z7189 Other specified counseling: Secondary | ICD-10-CM

## 2019-01-18 DIAGNOSIS — Z0000 Encounter for general adult medical examination without abnormal findings: Secondary | ICD-10-CM | POA: Diagnosis not present

## 2019-01-18 DIAGNOSIS — I10 Essential (primary) hypertension: Secondary | ICD-10-CM

## 2019-01-18 MED ORDER — AMLODIPINE BESYLATE 5 MG PO TABS: 5.0000 mg | ORAL_TABLET | Freq: Every day | ORAL | Status: DC

## 2019-01-18 NOTE — Patient Instructions (Addendum)
Check with your insurance to see if they will cover the shingrix shot. Thanks for getting a flu shot. Update me as needed.  Take care.  Glad to see you.  

## 2019-01-18 NOTE — Progress Notes (Signed)
I have personally reviewed the Medicare Annual Wellness questionnaire and have noted 1. The patient's medical and social history 2. Their use of alcohol, tobacco or illicit drugs 3. Their current medications and supplements 4. The patient's functional ability including ADL's, fall risks, home safety risks and hearing or visual             impairment. 5. Diet and physical activities 6. Evidence for depression or mood disorders  The patients weight, height, BMI have been recorded in the chart and visual acuity is per eye clinic.  I have made referrals, counseling and provided education to the patient based review of the above and I have provided the pt with a written personalized care plan for preventive services.  Provider list updated- see scanned forms.  Routine anticipatory guidance given to patient.  See health maintenance. The possibility exists that previously documented standard health maintenance information may have been brought forward from a previous encounter into this note.  If needed, that same information has been updated to reflect the current situation based on today's encounter.    Flu 2020 Shingles discussed with patient PNA up-to-date Tetanus 2013 Colon cancer screening done with Cologuard 2019 Prostate cancer screening 2020 Advance directive-wife designated if patient were incapacitated Cognitive function addressed- see scanned forms- and if abnormal then additional documentation follows.   Hypertension:    Using medication without problems or lightheadedness: see below Chest pain with exertion: No Edema: No Short of breath:no  Erectile dysfunction noted, mild.  Unclear if this is related to amlodipine.  Discussed trying half tablet daily and he will monitor his symptoms and his blood pressure.  He is putting up with knee discomfort, with episodic sx.    PMH and SH reviewed  Meds, vitals, and allergies reviewed.   ROS: Per HPI.  Unless specifically indicated  otherwise in HPI, the patient denies:  General: fever. Eyes: acute vision changes ENT: sore throat Cardiovascular: chest pain Respiratory: SOB GI: vomiting GU: dysuria Musculoskeletal: acute back pain Derm: acute rash Neuro: acute motor dysfunction Psych: worsening mood Endocrine: polydipsia Heme: bleeding Allergy: hayfever  GEN: nad, alert and oriented HEENT: Normocephalic atraumatic with mild fungal changes on the right tympanic membrane and canal.  Normal exam on the left (this is being managed per ENT) NECK: supple w/o LA CV: rrr. PULM: ctab, no inc wob ABD: soft, +bs EXT: no edema SKIN: no acute rash but he did have 2 small spots noted on the right abdomen where he thought he could have had a previous spider/insect bite.  There was a small amount of adherent material on 1 of the spots and this was removed with tweezers.  It was not identifiable because it was so small, even under magnification.  The bite site/lesions are only a few millimeters across and he has no spreading erythema or fluctuant mass.  He will observe the lesions and update me if worse in the meantime.  Health Maintenance  Topic Date Due  . Fecal DNA (Cologuard)  10/11/2020  . TETANUS/TDAP  04/29/2021  . INFLUENZA VACCINE  Completed  . Hepatitis C Screening  Completed  . PNA vac Low Risk Adult  Completed

## 2019-01-18 NOTE — Progress Notes (Signed)
Hearing Screening   125Hz  250Hz  500Hz  1000Hz  2000Hz  3000Hz  4000Hz  6000Hz  8000Hz   Right ear:           Left ear:           Comments: April 2020  Vision Screening Comments: April 2020

## 2019-01-21 NOTE — Assessment & Plan Note (Signed)
Erectile dysfunction noted, mild.  Unclear if this is related to amlodipine.  Discussed trying half tablet daily and he will monitor his symptoms and his blood pressure.

## 2019-01-21 NOTE — Assessment & Plan Note (Signed)
Flu 2020 Shingles discussed with patient PNA up-to-date Tetanus 2013 Colon cancer screening done with Cologuard 2019 Prostate cancer screening 2020 Advance directive-wife designated if patient were incapacitated Cognitive function addressed- see scanned forms- and if abnormal then additional documentation follows.

## 2019-01-21 NOTE — Assessment & Plan Note (Signed)
Advance directive- wife designated if patient were incapacitated.  

## 2019-02-05 DIAGNOSIS — H524 Presbyopia: Secondary | ICD-10-CM | POA: Diagnosis not present

## 2019-02-11 ENCOUNTER — Encounter: Payer: Self-pay | Admitting: Family Medicine

## 2019-02-14 ENCOUNTER — Other Ambulatory Visit: Payer: Self-pay | Admitting: Family Medicine

## 2019-02-14 MED ORDER — SILDENAFIL CITRATE 20 MG PO TABS: 20.0000 mg | ORAL_TABLET | Freq: Every day | ORAL | 12 refills | Status: DC | PRN

## 2019-03-16 ENCOUNTER — Other Ambulatory Visit: Payer: Self-pay | Admitting: *Deleted

## 2019-03-16 MED ORDER — AMLODIPINE BESYLATE 5 MG PO TABS: 5.0000 mg | ORAL_TABLET | Freq: Every day | ORAL | 1 refills | Status: DC

## 2019-03-19 DIAGNOSIS — I10 Essential (primary) hypertension: Secondary | ICD-10-CM | POA: Diagnosis not present

## 2019-03-19 DIAGNOSIS — E669 Obesity, unspecified: Secondary | ICD-10-CM | POA: Diagnosis not present

## 2019-03-19 DIAGNOSIS — G4733 Obstructive sleep apnea (adult) (pediatric): Secondary | ICD-10-CM | POA: Diagnosis not present

## 2019-04-13 DIAGNOSIS — G4733 Obstructive sleep apnea (adult) (pediatric): Secondary | ICD-10-CM | POA: Diagnosis not present

## 2019-04-17 ENCOUNTER — Telehealth: Payer: Self-pay | Admitting: *Deleted

## 2019-04-17 NOTE — Telephone Encounter (Signed)
Patient's wife left a voicemail stating that she heard that now people over 57 can sign up for the covid vaccine. Mrs. Coonrod wants to know if patient should take the vaccine?

## 2019-04-18 NOTE — Telephone Encounter (Signed)
Should be okay to get vaccinated.  I do not see a contraindication to the vaccine for him, so it makes sense to proceed when possible.  Thanks.

## 2019-04-18 NOTE — Telephone Encounter (Signed)
Patient notified as instructed by telephone and verbalized understanding. 

## 2019-05-18 ENCOUNTER — Ambulatory Visit: Payer: Medicare Other

## 2019-05-20 ENCOUNTER — Ambulatory Visit: Payer: Medicare Other | Attending: Internal Medicine

## 2019-05-20 DIAGNOSIS — Z23 Encounter for immunization: Secondary | ICD-10-CM | POA: Insufficient documentation

## 2019-05-20 NOTE — Progress Notes (Signed)
   Covid-19 Vaccination Clinic  Name:  Alan Castillo    MRN: [id number] DOB: 1945/10/01  05/20/2019  [name] was observed post Covid-19 immunization for 15 minutes without incidence. He was provided with Vaccine Information Sheet and instruction to access the V-Safe system.   [name] was instructed to call 911 with any severe reactions post vaccine: Marland Kitchen Difficulty breathing  . Swelling of your face and throat  . A fast heartbeat  . A bad rash all over your body  . Dizziness and weakness    Immunizations Administered    Name Date Dose VIS Date Route   Pfizer COVID-19 Vaccine 05/20/2019  9:08 AM 0.3 mL 03/16/2019 Intramuscular   Manufacturer: [employer], [employer]   Lot: [id number]   NDC: [address]-4

## 2019-06-01 DIAGNOSIS — G4733 Obstructive sleep apnea (adult) (pediatric): Secondary | ICD-10-CM | POA: Diagnosis not present

## 2019-06-13 ENCOUNTER — Ambulatory Visit: Payer: Medicare Other | Attending: Internal Medicine

## 2019-06-13 DIAGNOSIS — Z23 Encounter for immunization: Secondary | ICD-10-CM

## 2019-06-13 NOTE — Progress Notes (Signed)
   Covid-19 Vaccination Clinic  Name:  DAMON HARGROVE    MRN: [id number] DOB: April 19, 1945  06/13/2019  Mr. Rackley was observed post Covid-19 immunization for 15 minutes without incident. He was provided with Vaccine Information Sheet and instruction to access the V-Safe system.   Mr. Haydon was instructed to call 911 with any severe reactions post vaccine: Marland Kitchen Difficulty breathing  . Swelling of face and throat  . A fast heartbeat  . A bad rash all over body  . Dizziness and weakness   Immunizations Administered    Name Date Dose VIS Date Route   Pfizer COVID-19 Vaccine 06/13/2019  2:10 PM 0.3 mL 03/16/2019 Intramuscular   Manufacturer: [employer], [employer]   Lot: [id number]   NDC: [address]-8

## 2019-06-18 DIAGNOSIS — L270 Generalized skin eruption due to drugs and medicaments taken internally: Secondary | ICD-10-CM | POA: Diagnosis not present

## 2019-06-19 ENCOUNTER — Encounter: Payer: Self-pay | Admitting: Family Medicine

## 2019-06-21 NOTE — Telephone Encounter (Signed)
Pt left v/m that a dermatologist had dx a rash pt is having and thinks probablly caused by a drug reaction and pt only taking amlodipine and pt wants to know if could change to a different med. Pt request cb. Dr Para March is out of office and will send note to Dr Para March and Dr Milinda Antis who is in the office.[facility] [address].

## 2019-06-24 ENCOUNTER — Encounter: Payer: Self-pay | Admitting: Family Medicine

## 2019-06-24 ENCOUNTER — Other Ambulatory Visit: Payer: Self-pay | Admitting: Family Medicine

## 2019-06-24 NOTE — Progress Notes (Signed)
Med list updated

## 2019-06-29 DIAGNOSIS — G4733 Obstructive sleep apnea (adult) (pediatric): Secondary | ICD-10-CM | POA: Diagnosis not present

## 2019-07-30 DIAGNOSIS — G4733 Obstructive sleep apnea (adult) (pediatric): Secondary | ICD-10-CM | POA: Diagnosis not present

## 2019-10-29 DIAGNOSIS — G4733 Obstructive sleep apnea (adult) (pediatric): Secondary | ICD-10-CM | POA: Diagnosis not present

## 2019-12-26 DIAGNOSIS — H5213 Myopia, bilateral: Secondary | ICD-10-CM | POA: Diagnosis not present

## 2020-01-03 DIAGNOSIS — H5213 Myopia, bilateral: Secondary | ICD-10-CM | POA: Diagnosis not present

## 2020-01-21 ENCOUNTER — Other Ambulatory Visit: Payer: Self-pay | Admitting: Family Medicine

## 2020-01-21 DIAGNOSIS — E7800 Pure hypercholesterolemia, unspecified: Secondary | ICD-10-CM

## 2020-01-21 DIAGNOSIS — Z125 Encounter for screening for malignant neoplasm of prostate: Secondary | ICD-10-CM

## 2020-01-24 ENCOUNTER — Other Ambulatory Visit (INDEPENDENT_AMBULATORY_CARE_PROVIDER_SITE_OTHER): Payer: Medicare Other

## 2020-01-24 ENCOUNTER — Other Ambulatory Visit: Payer: Self-pay

## 2020-01-24 DIAGNOSIS — Z125 Encounter for screening for malignant neoplasm of prostate: Secondary | ICD-10-CM

## 2020-01-24 DIAGNOSIS — E7800 Pure hypercholesterolemia, unspecified: Secondary | ICD-10-CM | POA: Diagnosis not present

## 2020-01-24 LAB — LIPID PANEL
Cholesterol: 173 mg/dL (ref 0–200)
HDL: 46.9 mg/dL (ref >39.00)
LDL Cholesterol: 108 mg/dL — ABNORMAL HIGH (ref 0–99)
NonHDL: 125.63
Total CHOL/HDL Ratio: 4
Triglycerides: 88 mg/dL (ref 0.0–149.0)
VLDL: 17.6 mg/dL (ref 0.0–40.0)

## 2020-01-24 LAB — PSA, MEDICARE: PSA: 0.42 ng/ml (ref 0.10–4.00)

## 2020-01-25 LAB — COMPREHENSIVE METABOLIC PANEL
ALT: 16 U/L (ref 0–53)
AST: 20 U/L (ref 0–37)
Albumin: 4.1 g/dL (ref 3.5–5.2)
Alkaline Phosphatase: 53 U/L (ref 39–117)
BUN: 17 mg/dL (ref 6–23)
CO2: 29 mEq/L (ref 19–32)
Calcium: 9.2 mg/dL (ref 8.4–10.5)
Chloride: 103 mEq/L (ref 96–112)
Creatinine, Ser: 1.09 mg/dL (ref 0.40–1.50)
GFR: 71 mL/min (ref >60.00)
Glucose, Bld: 91 mg/dL (ref 70–99)
Potassium: 4.5 mEq/L (ref 3.5–5.1)
Sodium: 139 mEq/L (ref 135–145)
Total Bilirubin: 1 mg/dL (ref 0.2–1.2)
Total Protein: 6.3 g/dL (ref 6.0–8.3)

## 2020-01-29 DIAGNOSIS — G4733 Obstructive sleep apnea (adult) (pediatric): Secondary | ICD-10-CM | POA: Diagnosis not present

## 2020-01-31 ENCOUNTER — Other Ambulatory Visit: Payer: Self-pay

## 2020-01-31 ENCOUNTER — Encounter: Payer: Self-pay | Admitting: Family Medicine

## 2020-01-31 ENCOUNTER — Ambulatory Visit (INDEPENDENT_AMBULATORY_CARE_PROVIDER_SITE_OTHER): Payer: Medicare Other | Admitting: Family Medicine

## 2020-01-31 VITALS — BP 142/80 | HR 59 | Temp 96.4°F | Ht 70.0 in | Wt 223.6 lb

## 2020-01-31 DIAGNOSIS — N529 Male erectile dysfunction, unspecified: Secondary | ICD-10-CM

## 2020-01-31 DIAGNOSIS — Z0000 Encounter for general adult medical examination without abnormal findings: Secondary | ICD-10-CM

## 2020-01-31 DIAGNOSIS — Z23 Encounter for immunization: Secondary | ICD-10-CM

## 2020-01-31 DIAGNOSIS — I10 Essential (primary) hypertension: Secondary | ICD-10-CM

## 2020-01-31 DIAGNOSIS — Z7189 Other specified counseling: Secondary | ICD-10-CM

## 2020-01-31 MED ORDER — LISINOPRIL 10 MG PO TABS: 10.0000 mg | ORAL_TABLET | Freq: Every day | ORAL | 3 refills | Status: DC

## 2020-01-31 NOTE — Progress Notes (Signed)
This visit occurred during the SARS-CoV-2 public health emergency.  Safety protocols were in place, including screening questions prior to the visit, additional usage of staff PPE, and extensive cleaning of exam room while observing appropriate contact time as indicated for disinfecting solutions.  He has used triamcinolone ointment with improvement of rash.  Per derm.  I'll defer.  He had isolated event of vertigo with elevated BP at the time.  He has still been on amlodipine at baseline.  No CP, SOB, BLE edema.  We talked about changing BP meds.  ACE cautions and NSAID warning d/w pt.    ED.  Had used sildenafil.  No ADE on med.  Effect with 40mg  per dose.  Rare nocturia.    Flu 2021 Shingles discussed with patient PNA up-to-date Tetanus 2013 covid vaccine 2021.   Colon cancer screening done with Cologuard 2019 Prostate cancer screening 2021 Advance directive-wife designated if patient were incapacitated  Meds, vitals, and allergies reviewed.   ROS: Per HPI unless specifically indicated in ROS section   GEN: nad, alert and oriented HEENT: ncat NECK: supple w/o LA CV: rrr PULM: ctab, no inc wob ABD: soft, +bs EXT: no edema SKIN: faint rash noted on the R upper back.

## 2020-01-31 NOTE — Patient Instructions (Signed)
Stop amlodipine.  Change to lisinopril 10mg  a day.  If BP >140/>90 after 1 week, then increase to 20mg .  Update me as needed.   Let me know if the rash doesn't resolve.   Flu shot today.  covid booster when possible.   Take care.  Glad to see you.

## 2020-02-03 DIAGNOSIS — Z7189 Other specified counseling: Secondary | ICD-10-CM | POA: Insufficient documentation

## 2020-02-03 DIAGNOSIS — Z0000 Encounter for general adult medical examination without abnormal findings: Secondary | ICD-10-CM | POA: Insufficient documentation

## 2020-02-03 DIAGNOSIS — N529 Male erectile dysfunction, unspecified: Secondary | ICD-10-CM | POA: Insufficient documentation

## 2020-02-03 NOTE — Assessment & Plan Note (Signed)
Advance directive- wife designated if patient were incapacitated.  

## 2020-02-03 NOTE — Assessment & Plan Note (Addendum)
He has still been on amlodipine at baseline.  No CP, SOB, BLE edema.  We talked about changing BP meds since he still has his rash that was previously attributable to amlodipine.  ACE cautions and NSAID warning d/w pt. start lisinopril.  Labs discussed with patient.  He will update me as needed.  Start lisinopril 10 mg a day but he may need to increase to 20 mg depending on his blood pressure reading.  See after visit summary.  He will let me know if the rash does not resolve once he stops amlodipine.

## 2020-02-03 NOTE — Assessment & Plan Note (Signed)
Flu 2021 Shingles discussed with patient PNA up-to-date Tetanus 2013 covid vaccine 2021.   Colon cancer screening done with Cologuard 2019 Prostate cancer screening 2021 Advance directive-wife designated if patient were incapacitated

## 2020-02-03 NOTE — Assessment & Plan Note (Signed)
Had used sildenafil.  No ADE on med.  Effect with 40mg  per dose.  Rare nocturia.

## 2020-03-13 ENCOUNTER — Encounter: Payer: Self-pay | Admitting: Family Medicine

## 2020-03-17 ENCOUNTER — Ambulatory Visit (INDEPENDENT_AMBULATORY_CARE_PROVIDER_SITE_OTHER): Payer: Medicare Other | Admitting: Internal Medicine

## 2020-03-17 VITALS — BP 149/90 | HR 67 | Temp 97.7°F | Resp 14 | Ht 71.0 in | Wt 219.0 lb

## 2020-03-17 DIAGNOSIS — G4733 Obstructive sleep apnea (adult) (pediatric): Secondary | ICD-10-CM | POA: Diagnosis not present

## 2020-03-17 DIAGNOSIS — Z7189 Other specified counseling: Secondary | ICD-10-CM

## 2020-03-17 DIAGNOSIS — I10 Essential (primary) hypertension: Secondary | ICD-10-CM | POA: Diagnosis not present

## 2020-03-17 DIAGNOSIS — Z9989 Dependence on other enabling machines and devices: Secondary | ICD-10-CM

## 2020-03-17 NOTE — Progress Notes (Signed)
[facility] [address] [place], [place] [address]  Pulmonary Sleep Medicine   Office Visit Note  Patient Name: Alan Castillo DOB: [birth year] MRN [id number]    Chief Complaint: Obstructive Sleep Apnea visit  Brief History:  Alan Castillo is seen today for follow up The patient has a 8 year history of sleep apnea. Patient is using PAP nightly.  The patient feels more rested after sleeping with PAP.  The patient reports benefiting from PAP use. He wakes at 6 a.m. and may remove his mask for the last hour. Reported sleepiness is  improved and the Epworth Sleepiness Score is 5 out of 24. The patient occasionally will take naps after lunch. The patient complains of the following: congested nose and occasional condensation.  The compliance download shows excellent  compliance with an average use time of 6.8hours. The AHI is 4.4  The patient does not complain of limb movements disrupting sleep.  ROS  General: (-) fever, (-) chills, (-) night sweat Nose and Sinuses: (-) nasal stuffiness or itchiness, (-) postnasal drip, (-) nosebleeds, (-) sinus trouble. Mouth and Throat: (-) sore throat, (-) hoarseness. Neck: (-) swollen glands, (-) enlarged thyroid, (-) neck pain. Respiratory: + cough, - shortness of breath, - wheezing. Neurologic: - numbness, - tingling. Psychiatric: - anxiety, - depression   Current Medication: Outpatient Encounter Medications as of 03/17/2020  Medication Sig  . lisinopril (ZESTRIL) 10 MG tablet Take 1-2 tablets (10-20 mg total) by mouth daily.  . sildenafil (REVATIO) 20 MG tablet Take 1-5 tablets (20-100 mg total) by mouth daily as needed.  . triamcinolone ointment (KENALOG) 0.1 % Apply 1 application topically 2 (two) times daily as needed.   No facility-administered encounter medications on file as of 03/17/2020.    Surgical History: Past Surgical History:  Procedure Laterality Date  . Left knee surgery  05/2009   [facility] med meniscal repair (Dr.  Thomasena Edis)  . rectal fistula plug  01/02/07   [facility] (Dr. Lolita Patella)  . SHOULDER SURGERY  2013   L rotator cuff repair    Medical History: Past Medical History:  Diagnosis Date  . BPH (benign prostatic hyperplasia)   . Hypertension     Family History: Non contributory to the present illness  Social History: Social History   Socioeconomic History  . Marital status: Married    Spouse name: Not on file  . Number of children: 3  . Years of education: Not on file  . Highest education level: Not on file  Occupational History  . 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 Smoking status: Never Smoker  . Smokeless tobacco: Never Used  Substance and Sexual Activity  . Alcohol use: No    Alcohol/week: 0.0 standard drinks  . Drug use: No  . Sexual activity: Yes  Other Topics Concern  . Not on file  Social History Narrative   Married 1968, lives with wife   [employer] [place] [employer] [place]   3 kids, 2 are Programmer, systems   Social Determinants of Health   Financial Resource Strain: Not on file  Food Insecurity: Not on file  Transportation Needs: Not on file  Physical Activity: Not on file  Stress: Not on file  Social Connections: Not on file  Intimate Partner Violence: Not on file    Vital Signs: Blood pressure (!) 149/90, pulse 67, temperature 97.7 F (36.5 C), temperature source Temporal, resp. rate 14,  height 5\' 11"  (1.803 m), weight 219 lb (99.3 kg), SpO2 98 %.  Examination: General Appearance: The patient is well-developed, well-nourished, and in no distress. Neck Circumference: 41 Skin: Gross inspection of skin unremarkable. Head: normocephalic, no gross deformities. Eyes: no gross deformities noted. ENT: ears appear grossly normal Neurologic: Alert and oriented. No involuntary movements.    EPWORTH SLEEPINESS SCALE:  Scale:  (0)= no chance of dozing; (1)= slight chance of dozing; (2)=  moderate chance of dozing; (3)= high chance of dozing  Chance  Situtation     Sitting and reading: 0    Watching TV: 2    Sitting Inactive in public: 0    As a passenger in car: 0      Lying down to rest: 3    Sitting and talking: 0    Sitting quielty after lunch: 0    In a car, stopped in traffic: 0   TOTAL SCORE:   5 out of 24    SLEEP STUDIES:  1. PSG 06/14/11,  AHI  22.0,  low SpO2 86%   CPAP COMPLIANCE DATA:  Date Range: 03/14/20 - 03/13/20  Average Daily Use: 6:45 hours  Median Use: 6:50  Compliance for > 4 Hours: 98%   AHI: 4.4 respiratory events per hour  Days Used: 365/365  Mask Leak: 4.6  95th Percentile Pressure: 13.0 cmH2O         LABS: Recent Results (from the past 2160 hour(s))  PSA, Medicare     Status: None   Collection Time: 01/24/20  8:50 AM  Result Value Ref Range   PSA 0.42 0.10 - 4.00 ng/ml    Comment: Test performed using Access Hybritech PSA Assay, a parmagnetic partical, chemiluminecent immunoassay.  Lipid panel     Status: Abnormal   Collection Time: 01/24/20  8:50 AM  Result Value Ref Range   Cholesterol 173 0 - 200 mg/dL    Comment: ATP III Classification       Desirable:  < 200 mg/dL               Borderline High:  200 - 239 mg/dL          High:  > = 01/26/20 mg/dL   Triglycerides 160 0.0 - 149.0 mg/dL    Comment: Normal:  10.9 mg/dLBorderline High:  150 - 199 mg/dL   HDL <323 55.73 mg/dL   VLDL >22.02 0.0 - 54.2 mg/dL   LDL Cholesterol 70.6 (H) 0 - 99 mg/dL   Total CHOL/HDL Ratio 4     Comment:                Men          Women1/2 Average Risk     3.4          3.3Average Risk          5.0          4.42X Average Risk          9.6          7.13X Average Risk          15.0          11.0                       NonHDL 125.63     Comment: NOTE:  Non-HDL goal should be 30 mg/dL higher than patient's LDL goal (i.e. LDL goal of < 70 mg/dL, would have non-HDL goal of <  100 mg/dL)  Comprehensive metabolic panel     Status: None    Collection Time: 01/24/20  8:50 AM  Result Value Ref Range   Sodium 139 135 - 145 mEq/L   Potassium 4.5 3.5 - 5.1 mEq/L   Chloride 103 96 - 112 mEq/L   CO2 29 19 - 32 mEq/L   Glucose, Bld 91 70 - 99 mg/dL   BUN 17 6 - 23 mg/dL   Creatinine, Ser 6.96 0.40 - 1.50 mg/dL   Total Bilirubin 1.0 0.2 - 1.2 mg/dL   Alkaline Phosphatase 53 39 - 117 U/L   AST 20 0 - 37 U/L   ALT 16 0 - 53 U/L   Total Protein 6.3 6.0 - 8.3 g/dL   Albumin 4.1 3.5 - 5.2 g/dL   GFR 29.52 >84.13 mL/min   Calcium 9.2 8.4 - 10.5 mg/dL    Radiology: No results found.  No results found.  No results found.    Assessment and Plan: Patient Active Problem List   Diagnosis Date Noted  . OSA on CPAP 03/17/2020  . CPAP use counseling 02/03/2020  . Erectile dysfunction 02/03/2020  . Essential (primary) hypertension 02/13/2017  . Advance care planning 07/04/2014  . Medicare annual wellness visit, subsequent 06/26/2013  . FH: prostate cancer 06/26/2013  . Knee pain, bilateral 04/01/2011  . Hypercholesteremia 03/22/2011  . DIZZINESS, CHRONIC 03/26/2010  . BENIGN PROSTATIC HYPERTROPHY 03/09/2007  . ECZEMA 03/09/2007      The patient does tolerate PAP and reports significant benefit from PAP use. The patient was reminded how to clean the CPAP and advised to stop using the SoClean. He should increase the tubing temperature. The patient was also counselled to continue his regular exercise. The compliance is excellent. The AHI is borderline at 4.4. the pressure was increased to 15 cm H2O today and we will do a machine download in 2 weeks.   1. OSA- continue excellent compliance. 2. CPAP couseling-Discussed importance of adequate CPAP use as well as proper care and cleaning techniques of machine and all supplies. 3. HTN - followed and managed by PCP. Pt switched from amlodipine to Lisinopril 10mg . BP home checks btw 110-130s/70-80s. Noted mild dry cough since changed medications.  Pt to f/u with PCP.   General  Counseling: I have discussed the findings of the evaluation and examination with Jeven.  I have also discussed any further diagnostic evaluation thatmay be needed or ordered today. Alan Castillo verbalizes understanding of the findings of todays visit. We also reviewed his medications today and discussed drug interactions and side effects including but not limited excessive drowsiness and altered mental states. We also discussed that there is always a risk not just to him but also people around him. he has been encouraged to call the office with any questions or concerns that should arise related to todays visit.  No orders of the defined types were placed in this encounter.    This patient was seen by , AGNP-C in collaboration with Dr. Layla Barter as a part of collaborative care agreement.    I have personally obtained a history, examined the patient, evaluated laboratory and imaging results, formulated the assessment and plan and placed orders.   Freda Munro Valentino Hue, PhD, FAASM  Diplomate, American Board of Sleep Medicine    Sol Blazing, MD [facility] Diplomate ABMS Pulmonary and Critical Care Medicine Sleep medicine

## 2020-03-17 NOTE — Patient Instructions (Signed)

## 2020-03-20 ENCOUNTER — Other Ambulatory Visit: Payer: Self-pay | Admitting: Family Medicine

## 2020-03-20 NOTE — Telephone Encounter (Signed)
Please Advise

## 2020-03-23 NOTE — Telephone Encounter (Signed)
Medication discontinued.  Thanks.

## 2020-05-27 DIAGNOSIS — G4733 Obstructive sleep apnea (adult) (pediatric): Secondary | ICD-10-CM | POA: Diagnosis not present

## 2020-06-24 DIAGNOSIS — G4733 Obstructive sleep apnea (adult) (pediatric): Secondary | ICD-10-CM | POA: Diagnosis not present

## 2020-07-25 DIAGNOSIS — G4733 Obstructive sleep apnea (adult) (pediatric): Secondary | ICD-10-CM | POA: Diagnosis not present

## 2020-08-24 DIAGNOSIS — G4733 Obstructive sleep apnea (adult) (pediatric): Secondary | ICD-10-CM | POA: Diagnosis not present

## 2020-09-06 ENCOUNTER — Encounter: Payer: Self-pay | Admitting: Family Medicine

## 2020-09-08 NOTE — Telephone Encounter (Signed)
Pt called triage line also, Symptoms started 09-04-20 with head congestion, body aches. Elevated temp the next day. Normal now. Tested positive on home test 09-05-20. Head congestion is better. Now very fatigued and some discomfort in his chest. Occasional cough. Checking O2 sats and range from 93%. Dropped to high 80's. Some shortness of breath when that happens. Recovers on his back to over 90%. He has taking Mucinex DM at night. Asking if he can get an antiviral. Uses [facility] [address]. Please advise [phone].

## 2020-09-10 NOTE — Telephone Encounter (Signed)
I saw the patient's message for the first time on 09/10/2020 after business hours.  It was not routed to me with any marker of urgency or time sensitivity.  When I saw the message, I called the patient.  Fortunately he is better in the meantime.  He is still tired.  His previous taste changes have gotten some better.  He does not have any hypoxia on home pulse ox monitoring now.  His pulse ox is clearly improved and in the upper 90s.  He is out of the window for COVID-specific treatment.  He was previously vaccinated and at this point I expect him to make gradual improvement.  I asked him to call the clinic and ask for me if he has any deterioration or changes.  The whole situation was discussed in detail with the patient and I will address this with clinic staff.  A separate issue is his rash.  It has been present for months.  He was thought to have a drug rash related to amlodipine previously.  He previously stopped amlodipine.  He has been on lisinopril in the meantime.  The rash returned in the past few months.  His blood pressure has been controlled on lisinopril.  He does not have any lip or tongue swelling.  Given that the rash has been present for months and his blood pressure is controlled, we thought it made sense for him to recover from COVID and then get scheduled in the clinic so we can check him about the rash.  Neither the patient nor I thought he would have to stop lisinopril in the meantime.  He agrees with plan.  I thanked him for taking the call.

## 2020-09-18 ENCOUNTER — Ambulatory Visit (INDEPENDENT_AMBULATORY_CARE_PROVIDER_SITE_OTHER): Payer: Medicare Other | Admitting: Family Medicine

## 2020-09-18 ENCOUNTER — Encounter: Payer: Self-pay | Admitting: Family Medicine

## 2020-09-18 ENCOUNTER — Other Ambulatory Visit: Payer: Self-pay

## 2020-09-18 DIAGNOSIS — G4733 Obstructive sleep apnea (adult) (pediatric): Secondary | ICD-10-CM

## 2020-09-18 DIAGNOSIS — I10 Essential (primary) hypertension: Secondary | ICD-10-CM | POA: Diagnosis not present

## 2020-09-18 DIAGNOSIS — H8120 Vestibular neuronitis, unspecified ear: Secondary | ICD-10-CM | POA: Insufficient documentation

## 2020-09-18 DIAGNOSIS — L309 Dermatitis, unspecified: Secondary | ICD-10-CM | POA: Diagnosis not present

## 2020-09-18 DIAGNOSIS — Z9989 Dependence on other enabling machines and devices: Secondary | ICD-10-CM

## 2020-09-18 MED ORDER — HYDROCHLOROTHIAZIDE 12.5 MG PO TABS: 12.5000 mg | ORAL_TABLET | Freq: Every day | ORAL | 3 refills | Status: DC

## 2020-09-18 NOTE — Progress Notes (Signed)
This visit occurred during the SARS-CoV-2 public health emergency.  Safety protocols were in place, including screening questions prior to the visit, additional usage of staff PPE, and extensive cleaning of exam room while observing appropriate contact time as indicated for disinfecting solutions.  Prev covid sx d/w pt.  Pulse ox wnl.  He is getting better.  Rare cough.    Rash.  Still on lisinopril.  Taking 15mg  a day.  Prev rash off amlodipine.  He was better for weeks.  Then rash restarted in the last few months.  No lip or tongue swelling.  No other trigger for the rash identified.  No new soaps or detergents. H/o itchy patches of dry skin on the extremities.    Current rash d/w pt.  H/o eczema that had been better for years prior.  TAC helps with itching.    We talked about prednisone use.  No allergy but it can affect his energy level.    OSA on CPAP.  Compliant.  He was asking about options.  I told him we would check with Dr. with ENT about potential evaluation for inspire device.  Meds, vitals, and allergies reviewed.   ROS: Per HPI unless specifically indicated in ROS section   GEN: nad, alert and oriented HEENT: mucous membranes moist NECK: supple w/o LA CV: rrr.  no murmur PULM: ctab, no inc wob ABD: soft, +bs EXT: no edema SKIN: Dry patches that are separate from other red blotches that blanch.  Both itch but the dry patches itch more per patient report.  Skin well perfused.  No ulceration.  31 minutes were devoted to patient care in this encounter (this includes time spent reviewing the patient's file/history, interviewing and examining the patient, counseling/reviewing plan with patient).

## 2020-09-18 NOTE — Patient Instructions (Signed)
Stop lisinopril.  If your BP is >140/>90 then start hydrochlorothiazide.   Let me know about the rash situation in about 1-2 weeks, sooner if needed.  Take care.  Glad to see you.

## 2020-09-21 ENCOUNTER — Telehealth: Payer: Medicare Other | Admitting: Family Medicine

## 2020-09-21 NOTE — Assessment & Plan Note (Signed)
We will ask for ENT input.

## 2020-09-21 NOTE — Assessment & Plan Note (Signed)
Presumed to have rash from lisinopril that is separate from eczema.  Stop lisinopril.  Monitor blood pressure.  Can start hydrochlorothiazide if blood pressure is elevated.  See after visit summary.  He agrees with plan.  Okay for outpatient follow-up.

## 2020-09-21 NOTE — Assessment & Plan Note (Signed)
Continue triamcinolone ointment as needed.

## 2020-09-21 NOTE — Telephone Encounter (Signed)
Please check with Dr. Jenne Campus with ENT.  This patient has been seen there before.  I want to know if Dr. Jenne Campus could talk to him about the inspire device for sleep apnea.  I do not know if the patient would be a candidate but if he is, he may be interested.  Thanks.

## 2020-09-22 NOTE — Telephone Encounter (Signed)
Patient was last seen at [facility] in 2020; they stated if this is something the patient wants to discuss with Dr. Jenne Campus that they can definitely do that. Patient will need an appointment with Dr. Jenne Campus to do so. He does not need a referral since he is already a patient there; just has to call and schedule an appt.

## 2020-09-22 NOTE — Telephone Encounter (Signed)
Called and left message on [facility] [facility] nurse line about this.

## 2020-09-23 NOTE — Telephone Encounter (Signed)
Thanks for checking.  Please update patient.

## 2020-09-23 NOTE — Telephone Encounter (Signed)
Spoke with patient and advised he is okay to call Dr. Loraine Grip office to schedule appt if he wants to discuss the inspire device. Patient is fine with that and will call them to set up appt.

## 2020-10-02 DIAGNOSIS — G4733 Obstructive sleep apnea (adult) (pediatric): Secondary | ICD-10-CM | POA: Diagnosis not present

## 2020-11-24 DIAGNOSIS — G4733 Obstructive sleep apnea (adult) (pediatric): Secondary | ICD-10-CM | POA: Diagnosis not present

## 2020-12-29 DIAGNOSIS — H2513 Age-related nuclear cataract, bilateral: Secondary | ICD-10-CM | POA: Diagnosis not present

## 2020-12-29 DIAGNOSIS — H35371 Puckering of macula, right eye: Secondary | ICD-10-CM | POA: Diagnosis not present

## 2021-01-25 ENCOUNTER — Telehealth: Payer: Self-pay | Admitting: Family Medicine

## 2021-01-25 NOTE — Telephone Encounter (Signed)
Called patient and discussed that I would be out of clinic on Monday due to an illness.  Please contact patient and reschedule when possible.  Thanks.  

## 2021-01-26 ENCOUNTER — Encounter: Payer: Medicare Other | Admitting: Family Medicine

## 2021-01-27 NOTE — Telephone Encounter (Signed)
lvmtcb

## 2021-02-02 ENCOUNTER — Encounter: Payer: Self-pay | Admitting: Family Medicine

## 2021-02-02 ENCOUNTER — Encounter: Payer: Self-pay | Admitting: Ophthalmology

## 2021-02-02 MED ORDER — SILDENAFIL CITRATE 20 MG PO TABS: 20.0000 mg | ORAL_TABLET | Freq: Every day | ORAL | 12 refills | Status: DC | PRN

## 2021-02-03 DIAGNOSIS — H2511 Age-related nuclear cataract, right eye: Secondary | ICD-10-CM | POA: Diagnosis not present

## 2021-02-17 NOTE — Discharge Instructions (Signed)

## 2021-02-18 ENCOUNTER — Ambulatory Visit: Payer: Medicare Other | Admitting: Anesthesiology

## 2021-02-18 ENCOUNTER — Encounter
Admission: RE | Disposition: A | Discharge status: Home / Self Care | Payer: Self-pay | Source: Home / Self Care | Attending: Ophthalmology

## 2021-02-18 ENCOUNTER — Ambulatory Visit
Admission: RE | Admit: 2021-02-18 | Discharge: 2021-02-18 | Disposition: A | Discharge status: Home / Self Care | Payer: Medicare Other | Attending: Ophthalmology | Admitting: Ophthalmology

## 2021-02-18 DIAGNOSIS — K219 Gastro-esophageal reflux disease without esophagitis: Secondary | ICD-10-CM | POA: Diagnosis not present

## 2021-02-18 DIAGNOSIS — G4730 Sleep apnea, unspecified: Secondary | ICD-10-CM | POA: Diagnosis not present

## 2021-02-18 DIAGNOSIS — I10 Essential (primary) hypertension: Secondary | ICD-10-CM | POA: Insufficient documentation

## 2021-02-18 DIAGNOSIS — H2511 Age-related nuclear cataract, right eye: Secondary | ICD-10-CM | POA: Insufficient documentation

## 2021-02-18 DIAGNOSIS — H25811 Combined forms of age-related cataract, right eye: Secondary | ICD-10-CM | POA: Diagnosis not present

## 2021-02-18 HISTORY — DX: Gastro-esophageal reflux disease without esophagitis: K21.9

## 2021-02-18 HISTORY — DX: Dizziness and giddiness: R42

## 2021-02-18 HISTORY — DX: Sleep apnea, unspecified: G47.30

## 2021-02-18 HISTORY — PX: CATARACT EXTRACTION W/PHACO: SHX586

## 2021-02-18 SURGERY — PHACOEMULSIFICATION, CATARACT, WITH IOL INSERTION
Anesthesia: Monitor Anesthesia Care | Site: Eye | Laterality: Right

## 2021-02-18 MED ORDER — TETRACAINE HCL 0.5 % OP SOLN
1.0000 [drp] | OPHTHALMIC | Status: DC | PRN
  Administered 2021-02-18 (×3): 1 [drp] via OPHTHALMIC

## 2021-02-18 MED ORDER — MIDAZOLAM HCL 2 MG/2ML IJ SOLN
INTRAMUSCULAR | Status: DC | PRN
  Administered 2021-02-18: 2 mg via INTRAVENOUS

## 2021-02-18 MED ORDER — ARMC OPHTHALMIC DILATING DROPS
1.0000 "application " | OPHTHALMIC | Status: DC | PRN
  Administered 2021-02-18 (×3): 1 via OPHTHALMIC

## 2021-02-18 MED ORDER — BRIMONIDINE TARTRATE-TIMOLOL 0.2-0.5 % OP SOLN
OPHTHALMIC | Status: DC | PRN
  Administered 2021-02-18: 1 [drp] via OPHTHALMIC

## 2021-02-18 MED ORDER — SIGHTPATH DOSE#1 BSS IO SOLN
INTRAOCULAR | Status: DC | PRN
  Administered 2021-02-18: 15 mL

## 2021-02-18 MED ORDER — SIGHTPATH DOSE#1 BSS IO SOLN
INTRAOCULAR | Status: DC | PRN
  Administered 2021-02-18: 1 mL

## 2021-02-18 MED ORDER — SIGHTPATH DOSE#1 BSS IO SOLN
INTRAOCULAR | Status: DC | PRN
  Administered 2021-02-18: 60 mL via OPHTHALMIC

## 2021-02-18 MED ORDER — CEFUROXIME OPHTHALMIC INJECTION 1 MG/0.1 ML
INJECTION | OPHTHALMIC | Status: DC | PRN
  Administered 2021-02-18: 0.1 mL via INTRACAMERAL

## 2021-02-18 MED ORDER — FENTANYL CITRATE (PF) 100 MCG/2ML IJ SOLN
INTRAMUSCULAR | Status: DC | PRN
  Administered 2021-02-18 (×2): 50 ug via INTRAVENOUS

## 2021-02-18 MED ORDER — SIGHTPATH DOSE#1 NA HYALUR & NA CHOND-NA HYALUR IO KIT
PACK | INTRAOCULAR | Status: DC | PRN
  Administered 2021-02-18: 1 via OPHTHALMIC

## 2021-02-18 SURGICAL SUPPLY — 19 items
CANNULA ANT/CHMB 27G (MISCELLANEOUS) IMPLANT
CANNULA ANT/CHMB 27GA (MISCELLANEOUS) IMPLANT
GLOVE SRG 8 PF TXTR STRL LF DI (GLOVE) ×1 IMPLANT
GLOVE SURG ENC TEXT LTX SZ7.5 (GLOVE) ×3 IMPLANT
GLOVE SURG UNDER POLY LF SZ8 (GLOVE) ×3
GOWN STRL REUS W/ TWL LRG LVL3 (GOWN DISPOSABLE) ×2 IMPLANT
GOWN STRL REUS W/TWL LRG LVL3 (GOWN DISPOSABLE) ×6
LENS IOL TECNIS EYHANCE 18.0 (Intraocular Lens) ×3 IMPLANT
MARKER SKIN DUAL TIP RULER LAB (MISCELLANEOUS) ×3 IMPLANT
NDL CAPSULORHEX 25GA (NEEDLE) IMPLANT
NDL FILTER BLUNT 18X1 1/2 (NEEDLE) ×2 IMPLANT
NEEDLE CAPSULORHEX 25GA (NEEDLE) IMPLANT
NEEDLE FILTER BLUNT 18X 1/2SAF (NEEDLE) ×4
NEEDLE FILTER BLUNT 18X1 1/2 (NEEDLE) ×2 IMPLANT
PACK EYE AFTER SURG (MISCELLANEOUS) IMPLANT
SYR 3ML LL SCALE MARK (SYRINGE) ×6 IMPLANT
SYR TB 1ML LUER SLIP (SYRINGE) ×3 IMPLANT
WATER STERILE IRR 250ML POUR (IV SOLUTION) ×3 IMPLANT
WIPE NON LINTING 3.25X3.25 (MISCELLANEOUS) ×3 IMPLANT

## 2021-02-18 NOTE — Anesthesia Preprocedure Evaluation (Signed)
Anesthesia Evaluation  Patient identified by MRN, date of birth, ID band Patient awake    Reviewed: Allergy & Precautions, NPO status   Airway Mallampati: II  TM Distance: >3 FB     Dental   Pulmonary sleep apnea (dental device) ,    Pulmonary exam normal        Cardiovascular hypertension,  Rhythm:Regular Rate:Normal     Neuro/Psych    GI/Hepatic GERD  ,  Endo/Other    Renal/GU      Musculoskeletal   Abdominal   Peds  Hematology   Anesthesia Other Findings   Reproductive/Obstetrics                             Anesthesia Physical Anesthesia Plan  ASA: 2  Anesthesia Plan: MAC   Post-op Pain Management:    Induction: Intravenous  PONV Risk Score and Plan: TIVA, Midazolam and Treatment may vary due to age or medical condition  Airway Management Planned: Natural Airway and Nasal Cannula  Additional Equipment:   Intra-op Plan:   Post-operative Plan:   Informed Consent: I have reviewed the patients History and Physical, chart, labs and discussed the procedure including the risks, benefits and alternatives for the proposed anesthesia with the patient or authorized representative who has indicated his/her understanding and acceptance.       Plan Discussed with: CRNA  Anesthesia Plan Comments:         Anesthesia Quick Evaluation

## 2021-02-18 NOTE — Anesthesia Procedure Notes (Signed)
Procedure Name: MAC Date/Time: 02/18/2021 1:16 PM Performed by: Jeannene Patella, CRNA Pre-anesthesia Checklist: Patient identified, Emergency Drugs available, Suction available, Timeout performed and Patient being monitored Patient Re-evaluated:Patient Re-evaluated prior to induction Oxygen Delivery Method: Nasal cannula Placement Confirmation: positive ETCO2

## 2021-02-18 NOTE — H&P (Signed)
[facility]   Primary Care Physician:  Joaquim Nam, MD Ophthalmologist: Dr. Lockie Mola  Pre-Procedure History & Physical: HPI:  Alan Castillo is a 75 y.o. male here for ophthalmic surgery.   Past Medical History:  Diagnosis Date   BPH (benign prostatic hyperplasia)    GERD (gastroesophageal reflux disease)    Hypertension    Sleep apnea    wears a dental device   Vertigo     Past Surgical History:  Procedure Laterality Date   APPENDECTOMY     Left knee surgery  05/06/2009   L med meniscal repair (Dr. Thomasena Edis)   rectal fistula plug  01/02/2007   [facility] (Dr. Lolita Patella)   SHOULDER SURGERY  04/06/2011   L rotator cuff repair    Prior to Admission medications   Medication Sig Start Date End Date Taking? Authorizing Provider  hydrochlorothiazide (HYDRODIURIL) 12.5 MG tablet Take 1 tablet (12.5 mg total) by mouth daily. 09/18/20  Yes Joaquim Nam, MD  sildenafil (REVATIO) 20 MG tablet Take 1-5 tablets (20-100 mg total) by mouth daily as needed. 02/02/21   Joaquim Nam, MD  triamcinolone ointment (KENALOG) 0.1 % Apply 1 application topically 2 (two) times daily as needed.    [provider]    Allergies as of 12/30/2020 - Review Complete 09/18/2020  Allergen Reaction Noted   Amlodipine Other (See Comments) 09/01/2017   Lisinopril  09/18/2020    Family History  Problem Relation Age of Onset   Hypothyroidism Mother    Heart disease Mother        CHF, MI   Cancer Father        prostate, lung   Prostate cancer Father    Hypertension Sister    Stroke Maternal Grandfather    Colon cancer Neg Hx     Social History   Socioeconomic History   Marital status: Married    Spouse name: Not on file   Number of children: 3   Years of education: Not on file   Highest education level: Not on file  Occupational History   Occupation: Scientist, 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)    Employer: [employer]  Tobacco Use   Smoking  status: Never   Smokeless tobacco: Never  Substance and Sexual Activity   Alcohol use: No    Alcohol/week: 0.0 standard drinks   Drug use: No   Sexual activity: Yes  Other Topics Concern   Not on file  Social History Narrative   Married 1968, lives with wife   BS [place] [employer]   3 kids, 2 are Programmer, systems   Social Determinants of Health   Financial Resource Strain: Not on file  Food Insecurity: Not on file  Transportation Needs: Not on file  Physical Activity: Not on file  Stress: Not on file  Social Connections: Not on file  Intimate Partner Violence: Not on file    Review of Systems: See HPI, otherwise negative ROS  Physical Exam: BP (!) 155/96   Pulse (!) 59   Temp 97.9 F (36.6 C) (Temporal)   Resp 20   Ht 5\' 11"  (1.803 m)   Wt 98 kg   SpO2 99%   BMI 30.13 kg/m  General:   Alert,  pleasant and cooperative in NAD Head:  Normocephalic and atraumatic. Lungs:  Clear to auscultation.    Heart:  Regular rate and rhythm.   Impression/Plan: Alan Castillo is here for ophthalmic surgery.  Risks, benefits, limitations, and alternatives regarding ophthalmic surgery have been reviewed with the patient.  Questions have been answered.  All parties agreeable.   Lockie Mola, MD  02/18/2021, 12:45 PM

## 2021-02-18 NOTE — Op Note (Signed)
LOCATION:  [facility]   PREOPERATIVE DIAGNOSIS:    Nuclear sclerotic cataract right eye. H25.11   POSTOPERATIVE DIAGNOSIS:  Nuclear sclerotic cataract right eye.     PROCEDURE:  Phacoemusification with posterior chamber intraocular lens placement of the right eye   ULTRASOUND TIME: Procedure(s): CATARACT EXTRACTION PHACO AND INTRAOCULAR LENS PLACEMENT (IOC) RIGHT 13.00 01:19.4 (Right)  LENS:   Implant Name Type Inv. Item Serial No. Manufacturer Lot No. LRB No. Used Action  LENS IOL TECNIS EYHANCE 18.0 - [id number] Intraocular Lens LENS IOL TECNIS EYHANCE 18.0 [id number] JOHNSON   Right 1 Implanted         SURGEON:  Deirdre Evener, MD   ANESTHESIA:  Topical with tetracaine drops and 2% Xylocaine jelly, augmented with 1% preservative-free intracameral lidocaine.    COMPLICATIONS:  None.   DESCRIPTION OF PROCEDURE:  The patient was identified in the holding room and transported to the operating room and placed in the supine position under the operating microscope.  The right eye was identified as the operative eye and it was prepped and draped in the usual sterile ophthalmic fashion.   A 1 millimeter clear-corneal paracentesis was made at the 12:00 position.  0.5 ml of preservative-free 1% lidocaine was injected into the anterior chamber. The anterior chamber was filled with Viscoat viscoelastic.  A 2.4 millimeter keratome was used to make a near-clear corneal incision at the 9:00 position.  A curvilinear capsulorrhexis was made with a cystotome and capsulorrhexis forceps.  Balanced salt solution was used to hydrodissect and hydrodelineate the nucleus.   Phacoemulsification was then used in stop and chop fashion to remove the lens nucleus and epinucleus.  The remaining cortex was then removed using the irrigation and aspiration handpiece. Provisc was then placed into the capsular bag to distend it for lens placement.  A lens was then injected into the capsular bag.  The  remaining viscoelastic was aspirated.   Wounds were hydrated with balanced salt solution.  The anterior chamber was inflated to a physiologic pressure with balanced salt solution.  No wound leaks were noted. Cefuroxime 0.1 ml of a 10mg /ml solution was injected into the anterior chamber for a dose of 1 mg of intracameral antibiotic at the completion of the case.   Timolol and Brimonidine drops were applied to the eye.  The patient was taken to the recovery room in stable condition without complications of anesthesia or surgery.   Shannin Naab 02/18/2021, 1:29 PM

## 2021-02-18 NOTE — Progress Notes (Signed)
Bolus 1 liter LR in post op, after hypotensive episode.  VSS.  Now Ready for discharge

## 2021-02-18 NOTE — Transfer of Care (Signed)
Immediate Anesthesia Transfer of Care Note  Patient: Alan Castillo  Procedure(s) Performed: CATARACT EXTRACTION PHACO AND INTRAOCULAR LENS PLACEMENT (IOC) RIGHT 13.00 01:19.4 (Right: Eye)  Patient Location: PACU  Anesthesia Type: MAC  Level of Consciousness: awake, alert  and patient cooperative  Airway and Oxygen Therapy: Patient Spontanous Breathing and Patient connected to supplemental oxygen  Post-op Assessment: Post-op Vital signs reviewed, Patient's Cardiovascular Status Stable, Respiratory Function Stable, Patent Airway and No signs of Nausea or vomiting  Post-op Vital Signs: Reviewed and stable  Complications: No notable events documented.

## 2021-02-18 NOTE — Addendum Note (Signed)
Addendum  created 02/18/21 1423 by Jola Babinski, MD   Specialty comments modified

## 2021-02-18 NOTE — Anesthesia Postprocedure Evaluation (Signed)
Anesthesia Post Note  Patient: Alan Castillo  Procedure(s) Performed: CATARACT EXTRACTION PHACO AND INTRAOCULAR LENS PLACEMENT (IOC) RIGHT 13.00 01:19.4 (Right: Eye)     Patient location during evaluation: PACU Anesthesia Type: MAC Level of consciousness: awake Pain management: pain level controlled Vital Signs Assessment: post-procedure vital signs reviewed and stable Respiratory status: respiratory function stable Cardiovascular status: stable Postop Assessment: no apparent nausea or vomiting Anesthetic complications: no Comments: Vagal episode in PACU with significant hypotension and diaphoresis. Resolved with supine position.    No notable events documented.  Veda Canning

## 2021-02-19 ENCOUNTER — Other Ambulatory Visit: Payer: Self-pay

## 2021-02-19 ENCOUNTER — Encounter: Payer: Self-pay | Admitting: Ophthalmology

## 2021-02-24 DIAGNOSIS — H2512 Age-related nuclear cataract, left eye: Secondary | ICD-10-CM | POA: Diagnosis not present

## 2021-03-02 ENCOUNTER — Ambulatory Visit (INDEPENDENT_AMBULATORY_CARE_PROVIDER_SITE_OTHER): Payer: Medicare Other | Admitting: Family Medicine

## 2021-03-02 ENCOUNTER — Other Ambulatory Visit: Payer: Self-pay

## 2021-03-02 ENCOUNTER — Encounter: Payer: Self-pay | Admitting: Family Medicine

## 2021-03-02 VITALS — BP 130/72 | HR 57 | Temp 98.0°F | Ht 71.0 in | Wt 221.0 lb

## 2021-03-02 DIAGNOSIS — Z125 Encounter for screening for malignant neoplasm of prostate: Secondary | ICD-10-CM

## 2021-03-02 DIAGNOSIS — Z7189 Other specified counseling: Secondary | ICD-10-CM

## 2021-03-02 DIAGNOSIS — Z0000 Encounter for general adult medical examination without abnormal findings: Secondary | ICD-10-CM | POA: Diagnosis not present

## 2021-03-02 DIAGNOSIS — Z1211 Encounter for screening for malignant neoplasm of colon: Secondary | ICD-10-CM

## 2021-03-02 DIAGNOSIS — Z23 Encounter for immunization: Secondary | ICD-10-CM

## 2021-03-02 DIAGNOSIS — G4733 Obstructive sleep apnea (adult) (pediatric): Secondary | ICD-10-CM

## 2021-03-02 DIAGNOSIS — I10 Essential (primary) hypertension: Secondary | ICD-10-CM | POA: Diagnosis not present

## 2021-03-02 LAB — LIPID PANEL
Cholesterol: 196 mg/dL (ref 0–200)
HDL: 50 mg/dL (ref >39.00)
LDL Cholesterol: 113 mg/dL — ABNORMAL HIGH (ref 0–99)
NonHDL: 145.93
Total CHOL/HDL Ratio: 4
Triglycerides: 163 mg/dL — ABNORMAL HIGH (ref 0.0–149.0)
VLDL: 32.6 mg/dL (ref 0.0–40.0)

## 2021-03-02 LAB — CBC WITH DIFFERENTIAL/PLATELET
Basophils Absolute: 0.1 10*3/uL (ref 0.0–0.1)
Basophils Relative: 1.1 % (ref 0.0–3.0)
Eosinophils Absolute: 0.3 10*3/uL (ref 0.0–0.7)
Eosinophils Relative: 3.6 % (ref 0.0–5.0)
HCT: 47.4 % (ref 39.0–52.0)
Hemoglobin: 15.9 g/dL (ref 13.0–17.0)
Lymphocytes Relative: 25.1 % (ref 12.0–46.0)
Lymphs Abs: 1.9 10*3/uL (ref 0.7–4.0)
MCHC: 33.6 g/dL (ref 30.0–36.0)
MCV: 90.5 fl (ref 78.0–100.0)
Monocytes Absolute: 0.9 10*3/uL (ref 0.1–1.0)
Monocytes Relative: 11.9 % (ref 3.0–12.0)
Neutro Abs: 4.4 10*3/uL (ref 1.4–7.7)
Neutrophils Relative %: 58.3 % (ref 43.0–77.0)
Platelets: 229 10*3/uL (ref 150.0–400.0)
RBC: 5.23 Mil/uL (ref 4.22–5.81)
RDW: 13 % (ref 11.5–15.5)
WBC: 7.5 10*3/uL (ref 4.0–10.5)

## 2021-03-02 LAB — COMPREHENSIVE METABOLIC PANEL
ALT: 17 U/L (ref 0–53)
AST: 21 U/L (ref 0–37)
Albumin: 4.4 g/dL (ref 3.5–5.2)
Alkaline Phosphatase: 46 U/L (ref 39–117)
BUN: 18 mg/dL (ref 6–23)
CO2: 30 mEq/L (ref 19–32)
Calcium: 9.7 mg/dL (ref 8.4–10.5)
Chloride: 100 mEq/L (ref 96–112)
Creatinine, Ser: 1.22 mg/dL (ref 0.40–1.50)
GFR: 58.01 mL/min — ABNORMAL LOW (ref >60.00)
Glucose, Bld: 91 mg/dL (ref 70–99)
Potassium: 4.4 mEq/L (ref 3.5–5.1)
Sodium: 139 mEq/L (ref 135–145)
Total Bilirubin: 0.8 mg/dL (ref 0.2–1.2)
Total Protein: 7 g/dL (ref 6.0–8.3)

## 2021-03-02 LAB — TSH: TSH: 1.78 u[IU]/mL (ref 0.35–5.50)

## 2021-03-02 LAB — PSA, MEDICARE: PSA: 0.46 ng/ml (ref 0.10–4.00)

## 2021-03-02 NOTE — Patient Instructions (Addendum)
We'll call the eye clinic in the meantime.  Hold your HCTZ for now.  Restart 03/05/21 if feeling well.   Take care.  Glad to see you. Go to the lab on the way out.   If you have mychart we'll likely use that to update you.    We'll call about the cologuard test.

## 2021-03-02 NOTE — Progress Notes (Signed)
This visit occurred during the SARS-CoV-2 public health emergency.  Safety protocols were in place, including screening questions prior to the visit, additional usage of staff PPE, and extensive cleaning of exam room while observing appropriate contact time as indicated for disinfecting solutions.  I have personally reviewed the Medicare Annual Wellness questionnaire and have noted 1. The patient's medical and social history 2. Their use of alcohol, tobacco or illicit drugs 3. Their current medications and supplements 4. The patient's functional ability including ADL's, fall risks, home safety risks and hearing or visual             impairment. 5. Diet and physical activities 6. Evidence for depression or mood disorders  The patients weight, height, BMI have been recorded in the chart and visual acuity is per eye clinic.  I have made referrals, counseling and provided education to the patient based review of the above and I have provided the pt with a written personalized care plan for preventive services.  Provider list updated- see scanned forms.  Routine anticipatory guidance given to patient.  See health maintenance. The possibility exists that previously documented standard health maintenance information may have been brought forward from a previous encounter into this note.  If needed, that same information has been updated to reflect the current situation based on today's encounter.    Flu 2022 Shingles discussed with patient PNA up-to-date Tetanus 2013 COVID-vaccine previously done Cologuard pending 2022, discussed with patient about options. Prostate cancer screening pending.  See notes on labs. Advance directive-wife designated if patient were incapacitated. Cognitive function addressed- see scanned forms- and if abnormal then additional documentation follows.   In addition to [facility] Wellness, follow up visit for the below conditions:  Hypertension:    Using medication  without problems or lightheadedness: yes Chest pain with exertion:no Edema:no Short of breath:no Labs d/w pt.    Eye sx.  He had R cataract surgery 02/18/21.  R eye will be for reading and L will be for distance.  L eye surgery is pending for 03/04/21.  He had lower BP after his surgery.  He didn't have awareness of the surgery and felt diffusely weak right after the event.  He was told it was a vagal event.  He can exercise well o/w w/o CP.  No other syncope.    I checked with Dr. Inez Pilgrim.  In the absence of sig abnormality on his pending labs, then it should be fine to proceed with 2nd cataract surgery with patient holding 2 doses of HCTZ in the meantime.   Still with occ vertigo sx, mild compared to prev, sporadic.  Routine d/w pt.  He notes higher likelihood of sx with weather changes.   OSA.  Had used CPAP prev.  He changed to a dental device through ENT then dental clinic.  He was able to get off CPAP and he is doing well.  This is an improvement.  Sleeping well.    PMH and SH reviewed  Meds, vitals, and allergies reviewed.   ROS: Per HPI.  Unless specifically indicated otherwise in HPI, the patient denies:  General: fever. Eyes: acute vision changes ENT: sore throat Cardiovascular: chest pain Respiratory: SOB GI: vomiting GU: dysuria Musculoskeletal: acute back pain Derm: acute rash Neuro: acute motor dysfunction Psych: worsening mood Endocrine: polydipsia Heme: bleeding Allergy: hayfever  GEN: nad, alert and oriented HEENT: ncat NECK: supple w/o LA CV: rrr. PULM: ctab, no inc wob ABD: soft, +bs EXT: no edema SKIN: no acute rash

## 2021-03-04 ENCOUNTER — Ambulatory Visit
Admission: RE | Admit: 2021-03-04 | Discharge: 2021-03-04 | Disposition: A | Discharge status: Home / Self Care | Payer: Medicare Other | Attending: Ophthalmology | Admitting: Ophthalmology

## 2021-03-04 ENCOUNTER — Ambulatory Visit: Payer: Medicare Other | Admitting: Anesthesiology

## 2021-03-04 ENCOUNTER — Other Ambulatory Visit: Payer: Self-pay

## 2021-03-04 ENCOUNTER — Encounter
Admission: RE | Disposition: A | Discharge status: Home / Self Care | Payer: Self-pay | Source: Home / Self Care | Attending: Ophthalmology

## 2021-03-04 ENCOUNTER — Encounter: Payer: Self-pay | Admitting: Ophthalmology

## 2021-03-04 DIAGNOSIS — I10 Essential (primary) hypertension: Secondary | ICD-10-CM | POA: Insufficient documentation

## 2021-03-04 DIAGNOSIS — K219 Gastro-esophageal reflux disease without esophagitis: Secondary | ICD-10-CM | POA: Diagnosis not present

## 2021-03-04 DIAGNOSIS — H2512 Age-related nuclear cataract, left eye: Secondary | ICD-10-CM | POA: Insufficient documentation

## 2021-03-04 DIAGNOSIS — H25812 Combined forms of age-related cataract, left eye: Secondary | ICD-10-CM | POA: Diagnosis not present

## 2021-03-04 DIAGNOSIS — G4730 Sleep apnea, unspecified: Secondary | ICD-10-CM | POA: Diagnosis not present

## 2021-03-04 HISTORY — PX: CATARACT EXTRACTION W/PHACO: SHX586

## 2021-03-04 SURGERY — PHACOEMULSIFICATION, CATARACT, WITH IOL INSERTION
Anesthesia: Monitor Anesthesia Care | Site: Eye | Laterality: Left

## 2021-03-04 MED ORDER — ACETAMINOPHEN 160 MG/5ML PO SOLN: 975.0000 mg | Freq: Once | ORAL | Status: DC | PRN

## 2021-03-04 MED ORDER — TETRACAINE HCL 0.5 % OP SOLN
1.0000 [drp] | OPHTHALMIC | Status: DC | PRN
  Administered 2021-03-04 (×3): 1 [drp] via OPHTHALMIC

## 2021-03-04 MED ORDER — CEFUROXIME OPHTHALMIC INJECTION 1 MG/0.1 ML
INJECTION | OPHTHALMIC | Status: DC | PRN
  Administered 2021-03-04: 0.1 mL via INTRACAMERAL

## 2021-03-04 MED ORDER — ACETAMINOPHEN 500 MG PO TABS: 1000.0000 mg | ORAL_TABLET | Freq: Once | ORAL | Status: DC | PRN

## 2021-03-04 MED ORDER — FENTANYL CITRATE (PF) 100 MCG/2ML IJ SOLN
INTRAMUSCULAR | Status: DC | PRN
  Administered 2021-03-04: 50 ug via INTRAVENOUS

## 2021-03-04 MED ORDER — GLYCOPYRROLATE 0.2 MG/ML IJ SOLN
INTRAMUSCULAR | Status: DC | PRN
  Administered 2021-03-04: .1 mg via INTRAVENOUS

## 2021-03-04 MED ORDER — SIGHTPATH DOSE#1 BSS IO SOLN
INTRAOCULAR | Status: DC | PRN
  Administered 2021-03-04: 1 mL

## 2021-03-04 MED ORDER — LACTATED RINGERS IV SOLN: INTRAVENOUS | Status: DC

## 2021-03-04 MED ORDER — SIGHTPATH DOSE#1 BSS IO SOLN
INTRAOCULAR | Status: DC | PRN
  Administered 2021-03-04: 15 mL

## 2021-03-04 MED ORDER — MIDAZOLAM HCL 2 MG/2ML IJ SOLN
INTRAMUSCULAR | Status: DC | PRN
  Administered 2021-03-04: 1 mg via INTRAVENOUS

## 2021-03-04 MED ORDER — ONDANSETRON HCL 4 MG/2ML IJ SOLN: 4.0000 mg | Freq: Once | INTRAMUSCULAR | Status: DC | PRN

## 2021-03-04 MED ORDER — ARMC OPHTHALMIC DILATING DROPS
1.0000 "application " | OPHTHALMIC | Status: DC | PRN
  Administered 2021-03-04 (×3): 1 via OPHTHALMIC

## 2021-03-04 MED ORDER — SIGHTPATH DOSE#1 NA HYALUR & NA CHOND-NA HYALUR IO KIT
PACK | INTRAOCULAR | Status: DC | PRN
  Administered 2021-03-04: 1 via OPHTHALMIC

## 2021-03-04 MED ORDER — BRIMONIDINE TARTRATE-TIMOLOL 0.2-0.5 % OP SOLN
OPHTHALMIC | Status: DC | PRN
  Administered 2021-03-04: 1 [drp] via OPHTHALMIC

## 2021-03-04 SURGICAL SUPPLY — 20 items
CANNULA ANT/CHMB 27G (MISCELLANEOUS) ×1 IMPLANT
CANNULA ANT/CHMB 27GA (MISCELLANEOUS) ×2 IMPLANT
GLOVE SRG 8 PF TXTR STRL LF DI (GLOVE) ×1 IMPLANT
GLOVE SURG ENC TEXT LTX SZ7.5 (GLOVE) ×2 IMPLANT
GLOVE SURG UNDER POLY LF SZ8 (GLOVE) ×2
GOWN STRL REUS W/ TWL LRG LVL3 (GOWN DISPOSABLE) ×2 IMPLANT
GOWN STRL REUS W/TWL LRG LVL3 (GOWN DISPOSABLE) ×4
LENS IOL EYHANCE TORIC II 15.0 ×2 IMPLANT
LENS IOL EYHANCE TRC 150 15.0 IMPLANT
LENS IOL EYHNC TORIC 150 15.0 ×1 IMPLANT
MARKER SKIN DUAL TIP RULER LAB (MISCELLANEOUS) ×2 IMPLANT
NDL FILTER BLUNT 18X1 1/2 (NEEDLE) ×2 IMPLANT
NEEDLE CAPSULORHEX 25GA (NEEDLE) IMPLANT
NEEDLE FILTER BLUNT 18X 1/2SAF (NEEDLE) ×2
NEEDLE FILTER BLUNT 18X1 1/2 (NEEDLE) ×2 IMPLANT
PACK EYE AFTER SURG (MISCELLANEOUS) ×2 IMPLANT
SYR 3ML LL SCALE MARK (SYRINGE) ×4 IMPLANT
SYR TB 1ML LUER SLIP (SYRINGE) ×2 IMPLANT
WATER STERILE IRR 250ML POUR (IV SOLUTION) ×2 IMPLANT
WIPE NON LINTING 3.25X3.25 (MISCELLANEOUS) ×2 IMPLANT

## 2021-03-04 NOTE — Assessment & Plan Note (Signed)
Likely with combination of factors that contributed to a presumed vagal event (surgery, anesthesia, relatively prolonged fasting without significant fluid or caloric intake, blood pressure medication use).  Would temporarily hold hydrochlorothiazide but he should be able to restart after his procedure.  See above.  I checked with the eye clinic.  He has good exercise tolerance otherwise.  See notes on labs.

## 2021-03-04 NOTE — Op Note (Signed)
LOCATION:  [facility]   PREOPERATIVE DIAGNOSIS:  Nuclear sclerotic cataract of the left eye.  H25.12  POSTOPERATIVE DIAGNOSIS:  Nuclear sclerotic cataract of the left eye.   PROCEDURE:  Phacoemulsification with Toric posterior chamber intraocular lens placement of the left eye.  Ultrasound time: Procedure(s): CATARACT EXTRACTION PHACO AND INTRAOCULAR LENS PLACEMENT (IOC) LEFT EYHANCE toric lens 5.67 00:53.6 (Left)  LENS:   Implant Name Type Inv. Item Serial No. Manufacturer Lot No. LRB No. Used Action  TECNIS EYHANCE TORRIC II IOL Intraocular Lens  [id number] [employer]  Left 1 Implanted      [id number] 15.0 DToric intraocular lens with 1.5 diopters of cylindrical power with axis orientation at 143 degrees.     SURGEON:  Deirdre Evener, MD   ANESTHESIA:  Topical with tetracaine drops and 2% Xylocaine jelly, augmented with 1% preservative-free intracameral lidocaine.  COMPLICATIONS:  None.   DESCRIPTION OF PROCEDURE:  The patient was identified in the holding room and transported to the operating suite and placed in the supine position under the operating microscope.  The left eye was identified as the operative eye, and it was prepped and draped in the usual sterile ophthalmic fashion.    A clear-corneal paracentesis incision was made at the 1:30 position.  0.5 ml of preservative-free 1% lidocaine was injected into the anterior chamber. The anterior chamber was filled with Viscoat.  A 2.4 millimeter near clear corneal incision was then made at the 10:30 position.  A cystotome and capsulorrhexis forceps were then used to make a curvilinear capsulorrhexis.  Hydrodissection and hydrodelineation were then performed using balanced salt solution.   Phacoemulsification was then used in stop and chop fashion to remove the lens, nucleus and epinucleus.  The remaining cortex was aspirated using the irrigation and aspiration handpiece.  Provisc viscoelastic was then placed  into the capsular bag to distend it for lens placement.  The Verion digital marker was used to align the implant at the intended axis.   A Toric lens was then injected into the capsular bag.  It was rotated clockwise until the axis marks on the lens were approximately 15 degrees in the counterclockwise direction to the intended alignment.  The viscoelastic was aspirated from the eye using the irrigation aspiration handpiece.  Then, a Koch spatula through the sideport incision was used to rotate the lens in a clockwise direction until the axis markings of the intraocular lens were lined up with the Verion alignment.  Balanced salt solution was then used to hydrate the wounds. Cefuroxime 0.1 ml of a 10mg /ml solution was injected into the anterior chamber for a dose of 1 mg of intracameral antibiotic at the completion of the case.    The eye was noted to have a physiologic pressure and there was no wound leak noted.   Timolol and Brimonidine drops were applied to the eye.  The patient was taken to the recovery room in stable condition having had no complications of anesthesia or surgery.  Elara Cocke 03/04/2021, 10:43 AM

## 2021-03-04 NOTE — Anesthesia Postprocedure Evaluation (Signed)
Anesthesia Post Note  Patient: Alan Castillo  Procedure(s) Performed: CATARACT EXTRACTION PHACO AND INTRAOCULAR LENS PLACEMENT (IOC) LEFT EYHANCE toric lens 5.67 00:53.6 (Left: Eye)     Patient location during evaluation: PACU Anesthesia Type: MAC Level of consciousness: awake and alert Pain management: pain level controlled Vital Signs Assessment: post-procedure vital signs reviewed and stable Respiratory status: spontaneous breathing, nonlabored ventilation and respiratory function stable Cardiovascular status: stable and blood pressure returned to baseline Postop Assessment: no apparent nausea or vomiting Anesthetic complications: no   No notable events documented.  April Manson

## 2021-03-04 NOTE — H&P (Signed)
[facility]   Primary Care Physician:  Joaquim Nam, MD Ophthalmologist: Dr. Lockie Mola  Pre-Procedure History & Physical: HPI:  Alan Castillo is a 75 y.o. male here for ophthalmic surgery.   Past Medical History:  Diagnosis Date   BPH (benign prostatic hyperplasia)    GERD (gastroesophageal reflux disease)    Hypertension    Sleep apnea    wears a dental device   Vertigo     Past Surgical History:  Procedure Laterality Date   APPENDECTOMY     CATARACT EXTRACTION W/PHACO Right 02/18/2021   Procedure: CATARACT EXTRACTION PHACO AND INTRAOCULAR LENS PLACEMENT (IOC) RIGHT 13.00 01:19.4;  Surgeon: Lockie Mola, MD;  Location: [facility];  Service: Ophthalmology;  Laterality: Right;   Left knee surgery  05/06/2009   L med meniscal repair (Dr. Thomasena Edis)   rectal fistula plug  01/02/2007   [facility] (Dr. Lolita Patella)   SHOULDER SURGERY  04/06/2011   L rotator cuff repair    Prior to Admission medications   Medication Sig Start Date End Date Taking? Authorizing Provider  hydrochlorothiazide (HYDRODIURIL) 12.5 MG tablet Take 1 tablet (12.5 mg total) by mouth daily. 09/18/20  Yes Joaquim Nam, MD  sildenafil (REVATIO) 20 MG tablet Take 1-5 tablets (20-100 mg total) by mouth daily as needed. 02/02/21  Yes Joaquim Nam, MD  triamcinolone ointment (KENALOG) 0.1 % Apply 1 application topically 2 (two) times daily as needed.   Yes [provider]    Allergies as of 12/30/2020 - Review Complete 09/18/2020  Allergen Reaction Noted   Amlodipine Other (See Comments) 09/01/2017   Lisinopril  09/18/2020    Family History  Problem Relation Age of Onset   Hypothyroidism Mother    Heart disease Mother        CHF, MI   Cancer Father        prostate, lung   Prostate cancer Father    Hypertension Sister    Stroke Maternal Grandfather    Colon cancer Neg Hx     Social History   Socioeconomic History   Marital status: Married    Spouse  name: Not on file   Number of children: 3   Years of education: Not on file   Highest education level: Not on file  Occupational History   Occupation: Scientist, 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)    Employer: [employer]  Tobacco Use   Smoking status: Never   Smokeless tobacco: Never  Substance and Sexual Activity   Alcohol use: No    Alcohol/week: 0.0 standard drinks   Drug use: No   Sexual activity: Yes  Other Topics Concern   Not on file  Social History Narrative   Married 1968, lives with wife   [place] [employer]   3 kids, 2 are Programmer, systems   Social Determinants of Health   Financial Resource Strain: Not on file  Food Insecurity: Not on file  Transportation Needs: Not on file  Physical Activity: Not on file  Stress: Not on file  Social Connections: Not on file  Intimate Partner Violence: Not on file    Review of Systems: See HPI, otherwise negative ROS  Physical Exam: BP (!) 151/92   Pulse 68   Temp 97.7 F (36.5 C) (Temporal)   Resp 16   Ht 5\' 11"  (1.803 m)   Wt 99.3 kg   SpO2 99%   BMI 30.54 kg/m  General:   Alert,  pleasant and cooperative in NAD Head:  Normocephalic and atraumatic. Lungs:  Clear to auscultation.    Heart:  Regular rate and rhythm.  Impression/Plan: Alan Castillo is here for ophthalmic surgery.  Risks, benefits, limitations, and alternatives regarding ophthalmic surgery have been reviewed with the patient.  Questions have been answered.  All parties agreeable.   Lockie Mola, MD  03/04/2021, 9:54 AM

## 2021-03-04 NOTE — Transfer of Care (Signed)
Immediate Anesthesia Transfer of Care Note  Patient: Alan Castillo  Procedure(s) Performed: CATARACT EXTRACTION PHACO AND INTRAOCULAR LENS PLACEMENT (IOC) LEFT EYHANCE toric lens 5.67 00:53.6 (Left: Eye)  Patient Location: PACU  Anesthesia Type: MAC  Level of Consciousness: awake, alert  and patient cooperative  Airway and Oxygen Therapy: Patient Spontanous Breathing and Patient connected to supplemental oxygen  Post-op Assessment: Post-op Vital signs reviewed, Patient's Cardiovascular Status Stable, Respiratory Function Stable, Patent Airway and No signs of Nausea or vomiting  Post-op Vital Signs: Reviewed and stable  Complications: No notable events documented.

## 2021-03-04 NOTE — Anesthesia Preprocedure Evaluation (Addendum)
Anesthesia Evaluation  Patient identified by MRN, date of birth, ID band Patient awake    Reviewed: Allergy & Precautions, NPO status   History of Anesthesia Complications (+) history of anesthetic complications (Vagal episode post-cataract)  Airway Mallampati: II  TM Distance: >3 FB     Dental   Pulmonary sleep apnea (dental device) ,    Pulmonary exam normal        Cardiovascular hypertension,  Rhythm:Regular Rate:Normal     Neuro/Psych    GI/Hepatic GERD  ,  Endo/Other    Renal/GU      Musculoskeletal   Abdominal   Peds  Hematology   Anesthesia Other Findings   Reproductive/Obstetrics                             Anesthesia Physical  Anesthesia Plan  ASA: 2  Anesthesia Plan: MAC   Post-op Pain Management:    Induction: Intravenous  PONV Risk Score and Plan: 1 and TIVA, Midazolam and Treatment may vary due to age or medical condition  Airway Management Planned: Natural Airway and Nasal Cannula  Additional Equipment:   Intra-op Plan:   Post-operative Plan:   Informed Consent: I have reviewed the patients History and Physical, chart, labs and discussed the procedure including the risks, benefits and alternatives for the proposed anesthesia with the patient or authorized representative who has indicated his/her understanding and acceptance.       Plan Discussed with: CRNA  Anesthesia Plan Comments: (Will give some preop fluids, small dose glyco, ease back on sedation dosing.)       Anesthesia Quick Evaluation

## 2021-03-04 NOTE — Assessment & Plan Note (Signed)
OSA.  Had used CPAP prev.  He changed to a dental device through ENT then dental clinic.  He was able to get off CPAP and he is doing well.  This is an improvement.  Sleeping well.  Continue as is.

## 2021-03-04 NOTE — Assessment & Plan Note (Signed)
Flu 2022 Shingles discussed with patient PNA up-to-date Tetanus 2013 COVID-vaccine previously done Cologuard pending 2022, discussed with patient about options. Prostate cancer screening pending.  See notes on labs. Advance directive-wife designated if patient were incapacitated. Cognitive function addressed- see scanned forms- and if abnormal then additional documentation follows.

## 2021-03-04 NOTE — Anesthesia Procedure Notes (Signed)
Procedure Name: MAC Date/Time: 03/04/2021 10:30 AM Performed by: Cameron Ali, CRNA Pre-anesthesia Checklist: Patient identified, Emergency Drugs available, Suction available, Timeout performed and Patient being monitored Patient Re-evaluated:Patient Re-evaluated prior to induction Oxygen Delivery Method: Nasal cannula Placement Confirmation: positive ETCO2

## 2021-03-04 NOTE — Assessment & Plan Note (Signed)
Advance directive- wife designated if patient were incapacitated.  

## 2021-03-05 ENCOUNTER — Encounter: Payer: Self-pay | Admitting: Family Medicine

## 2021-03-06 DIAGNOSIS — Z1211 Encounter for screening for malignant neoplasm of colon: Secondary | ICD-10-CM | POA: Diagnosis not present

## 2021-03-12 LAB — COLOGUARD: COLOGUARD: NEGATIVE

## 2021-06-08 DIAGNOSIS — L821 Other seborrheic keratosis: Secondary | ICD-10-CM | POA: Diagnosis not present

## 2021-06-08 DIAGNOSIS — D225 Melanocytic nevi of trunk: Secondary | ICD-10-CM | POA: Diagnosis not present

## 2021-06-08 DIAGNOSIS — D2272 Melanocytic nevi of left lower limb, including hip: Secondary | ICD-10-CM | POA: Diagnosis not present

## 2021-06-08 DIAGNOSIS — D2261 Melanocytic nevi of right upper limb, including shoulder: Secondary | ICD-10-CM | POA: Diagnosis not present

## 2021-06-08 DIAGNOSIS — D2262 Melanocytic nevi of left upper limb, including shoulder: Secondary | ICD-10-CM | POA: Diagnosis not present

## 2021-09-03 ENCOUNTER — Other Ambulatory Visit: Payer: Self-pay | Admitting: Family Medicine

## 2021-09-07 ENCOUNTER — Encounter: Payer: Self-pay | Admitting: Family

## 2021-09-07 ENCOUNTER — Ambulatory Visit (INDEPENDENT_AMBULATORY_CARE_PROVIDER_SITE_OTHER): Payer: Medicare Other | Admitting: Family

## 2021-09-07 VITALS — BP 122/80 | HR 69 | Temp 98.1°F | Ht 71.0 in | Wt 223.0 lb

## 2021-09-07 DIAGNOSIS — R051 Acute cough: Secondary | ICD-10-CM | POA: Diagnosis not present

## 2021-09-07 DIAGNOSIS — J0110 Acute frontal sinusitis, unspecified: Secondary | ICD-10-CM | POA: Insufficient documentation

## 2021-09-07 MED ORDER — GUAIFENESIN-CODEINE 100-10 MG/5ML PO SYRP: 5.0000 mL | ORAL_SOLUTION | Freq: Three times a day (TID) | ORAL | 0 refills | Status: AC | PRN

## 2021-09-07 MED ORDER — AMOXICILLIN-POT CLAVULANATE 875-125 MG PO TABS: 1.0000 | ORAL_TABLET | Freq: Two times a day (BID) | ORAL | 0 refills | Status: DC

## 2021-09-07 NOTE — Assessment & Plan Note (Signed)
Prescription given for augmentin 875/125 mg po bid for ten days. Pt to continue tylenol/ibuprofen prn sinus pain. Continue with humidifier prn and steam showers recommended as well. instructed If no symptom improvement in 48 hours please f/u ? ?

## 2021-09-07 NOTE — Progress Notes (Signed)
Established Patient Office Visit  Subjective:  Patient ID: COADY TRAIN, male    DOB: [birth year]  Age: 76 y.o. MRN: [id number]  CC:  Chief Complaint  Patient presents with   URI    Started about 1 week ago with post nasal drip. Had a sore throat with laryngitis. Coughing at times. Has been Mucinex DM with a little relief.    HPI Alan Castillo is here today with concerns.   Has lost his voice a bit, started to come back slightly.  Sore throat with sinus pressure, post nasal drip, and now has with productive cough. No chest congestion. Ribs do hurt a bit from coughing so much.  Also with nasal congestion.  Sneezing slightly. No wheezing or sob.   Mucinex dm with some mild relief.   Past Medical History:  Diagnosis Date   BPH (benign prostatic hyperplasia)    GERD (gastroesophageal reflux disease)    Hypertension    Sleep apnea    wears a dental device   Vertigo     Past Surgical History:  Procedure Laterality Date   APPENDECTOMY     CATARACT EXTRACTION W/PHACO Right 02/18/2021   Procedure: CATARACT EXTRACTION PHACO AND INTRAOCULAR LENS PLACEMENT (IOC) RIGHT 13.00 01:19.4;  Surgeon: Lockie Mola, MD;  Location: [facility];  Service: Ophthalmology;  Laterality: Right;   CATARACT EXTRACTION W/PHACO Left 03/04/2021   Procedure: CATARACT EXTRACTION PHACO AND INTRAOCULAR LENS PLACEMENT (IOC) LEFT EYHANCE toric lens 5.67 00:53.6;  Surgeon: Lockie Mola, MD;  Location: [facility];  Service: Ophthalmology;  Laterality: Left;   Left knee surgery  05/06/2009   L med meniscal repair (Dr. Thomasena Edis)   rectal fistula plug  01/02/2007   [facility] (Dr. 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)    Employer: [employer]  Tobacco Use   Smoking status: Never    Passive exposure: Past   Smokeless tobacco: Never  Substance and Sexual Activity   Alcohol use: No    Alcohol/week: 0.0 standard drinks   Drug use: No   Sexual activity: Yes  Other Topics Concern   Not on file  Social History Narrative   Married 1968, lives with wife   [employer] [place]   3 kids, 2 are Programmer, systems   Social Determinants of Health   Financial Resource Strain: Not on file  Food Insecurity: Not on file  Transportation Needs: Not on file  Physical Activity: Not on file  Stress: Not on file  Social Connections: Not on file  Intimate Partner Violence: Not on file    Outpatient Medications Prior to Visit  Medication Sig Dispense Refill   hydrochlorothiazide (HYDRODIURIL) 12.5 MG tablet TAKE 1 TABLET(12.5 MG)  BY MOUTH DAILY 90 tablet 3   sildenafil (REVATIO) 20 MG tablet Take 1-5 tablets (20-100 mg total) by mouth daily as needed. 50 tablet 12   triamcinolone ointment (KENALOG) 0.1 % Apply 1 application topically 2 (two) times daily as needed.     No facility-administered medications prior to visit.    Allergies  Allergen Reactions   Amlodipine Other (See Comments)    Rash   Lisinopril     Possible rash        Objective:    Physical Exam Constitutional:      General: He is awake. He is not in acute distress.    Appearance: Normal appearance. He is not ill-appearing.  HENT:     Right Ear: Tympanic membrane normal.     Left Ear: Tympanic membrane normal.     Nose:     Right Turbinates: Not enlarged or swollen.     Left Turbinates: Not enlarged or  swollen.     Right Sinus: Frontal sinus tenderness present. No maxillary sinus tenderness.     Left Sinus: Frontal sinus tenderness present. No maxillary sinus tenderness.     Mouth/Throat:     Mouth: Mucous membranes are moist.     Pharynx: No pharyngeal swelling, oropharyngeal exudate or posterior oropharyngeal erythema.  Eyes:     Extraocular Movements: Extraocular movements intact.     Pupils: Pupils are equal, round, and reactive to light.  Cardiovascular:     Rate and Rhythm: Normal rate and regular rhythm.  Pulmonary:     Effort: Pulmonary effort is normal.     Breath sounds: Normal breath sounds. No wheezing.  Neurological:     Mental Status: He is alert.    BP 122/80 (BP Location: Left Arm, Patient Position: Sitting, Cuff Size: Large)   Pulse 69   Temp 98.1 F (36.7 C)   Ht 5\' 11"  (1.803 m)   Wt 223 lb (101.2 kg)   SpO2 97%   BMI 31.10 kg/m  Wt Readings from Last 3 Encounters:  09/07/21 223 lb (101.2 kg)  03/04/21 219 lb (99.3 kg)  03/02/21 221 lb (100.2 kg)     Health Maintenance Due  Topic Date Due   Zoster Vaccines- Shingrix (1 of 2) Never done   COVID-19 Vaccine (3 - Booster for Pfizer series) 08/08/2019   TETANUS/TDAP  04/29/2021    There are no preventive care reminders to display for this patient.  Lab Results  Component Value Date   TSH 1.78 03/02/2021   Lab Results  Component Value Date   WBC 7.5 03/02/2021   HGB 15.9 03/02/2021   HCT 47.4 03/02/2021   MCV 90.5 03/02/2021   PLT 229.0 03/02/2021   Lab Results  Component Value Date   NA 139 03/02/2021   K 4.4 03/02/2021   CO2 30 03/02/2021   GLUCOSE 91 03/02/2021   BUN 18 03/02/2021   CREATININE 1.22 03/02/2021   BILITOT 0.8 03/02/2021   ALKPHOS 46 03/02/2021   AST 21 03/02/2021   ALT 17 03/02/2021   PROT 7.0 03/02/2021   ALBUMIN 4.4 03/02/2021   CALCIUM 9.7 03/02/2021   ANIONGAP 9 02/23/2017   GFR 58.01 (L) 03/02/2021   No results found for: HGBA1C    Assessment & Plan:    Problem List Items Addressed This Visit       Respiratory   Acute non-recurrent frontal sinusitis - Primary    Prescription given for augmentin 875/125 mg po bid for ten days. Pt to continue  tylenol/ibuprofen prn sinus pain. Continue with humidifier prn and steam showers recommended as well. instructed If no symptom improvement in 48 hours please f/u        Relevant Medications   amoxicillin-clavulanate (AUGMENTIN) 875-125 MG tablet   guaiFENesin-codeine (ROBITUSSIN AC) 100-10 MG/5ML syrup     Other   Acute cough    mucinex prn  PDMP reviewed no suspicious activity  rx guai codeine cough medication, pt has tolerated in the past  Will help with sleep        Relevant Medications   guaiFENesin-codeine (ROBITUSSIN AC) 100-10 MG/5ML syrup    Meds ordered this encounter  Medications   amoxicillin-clavulanate (AUGMENTIN) 875-125 MG tablet    Sig: Take 1 tablet by mouth 2 (two) times daily.    Dispense:  20 tablet    Refill:  0    Order Specific Question:   Supervising Provider    Answer:   BEDSOLE, AMY E [[id number]]   guaiFENesin-codeine (ROBITUSSIN AC) 100-10 MG/5ML syrup    Sig: Take 5 mLs by mouth 3 (three) times daily as needed for up to 5 days for cough.    Dispense:  75 mL    Refill:  0    Order Specific Question:   Supervising Provider    Answer:   BEDSOLE, AMY E [[id number]]    Follow-up: No follow-ups on file.    Mort Sawyersabitha Samanvi Cuccia, FNP

## 2021-09-07 NOTE — Assessment & Plan Note (Signed)
mucinex prn  PDMP reviewed no suspicious activity  rx guai codeine cough medication, pt has tolerated in the past  Will help with sleep

## 2021-09-09 ENCOUNTER — Other Ambulatory Visit: Payer: Self-pay

## 2021-09-09 ENCOUNTER — Encounter: Payer: Self-pay | Admitting: Family

## 2021-09-09 ENCOUNTER — Emergency Department
Admission: EM | Admit: 2021-09-09 | Discharge: 2021-09-09 | Disposition: A | Discharge status: Home / Self Care | Payer: Medicare Other | Attending: Emergency Medicine | Admitting: Emergency Medicine

## 2021-09-09 ENCOUNTER — Emergency Department: Payer: Medicare Other

## 2021-09-09 ENCOUNTER — Encounter: Payer: Self-pay | Admitting: Family Medicine

## 2021-09-09 ENCOUNTER — Encounter: Payer: Self-pay | Admitting: Emergency Medicine

## 2021-09-09 ENCOUNTER — Telehealth: Payer: Self-pay | Admitting: Family Medicine

## 2021-09-09 DIAGNOSIS — Z0389 Encounter for observation for other suspected diseases and conditions ruled out: Secondary | ICD-10-CM | POA: Diagnosis not present

## 2021-09-09 DIAGNOSIS — I700 Atherosclerosis of aorta: Secondary | ICD-10-CM | POA: Diagnosis not present

## 2021-09-09 DIAGNOSIS — M79671 Pain in right foot: Secondary | ICD-10-CM

## 2021-09-09 DIAGNOSIS — M7989 Other specified soft tissue disorders: Secondary | ICD-10-CM | POA: Diagnosis not present

## 2021-09-09 DIAGNOSIS — R600 Localized edema: Secondary | ICD-10-CM | POA: Diagnosis present

## 2021-09-09 DIAGNOSIS — D72829 Elevated white blood cell count, unspecified: Secondary | ICD-10-CM | POA: Insufficient documentation

## 2021-09-09 DIAGNOSIS — M109 Gout, unspecified: Secondary | ICD-10-CM | POA: Insufficient documentation

## 2021-09-09 DIAGNOSIS — M79672 Pain in left foot: Secondary | ICD-10-CM | POA: Diagnosis not present

## 2021-09-09 LAB — CBC WITH DIFFERENTIAL/PLATELET
Abs Immature Granulocytes: 0.04 10*3/uL (ref 0.00–0.07)
Basophils Absolute: 0.1 10*3/uL (ref 0.0–0.1)
Basophils Relative: 1 %
Eosinophils Absolute: 0.3 10*3/uL (ref 0.0–0.5)
Eosinophils Relative: 3 %
HCT: 47.1 % (ref 39.0–52.0)
Hemoglobin: 15.8 g/dL (ref 13.0–17.0)
Immature Granulocytes: 0 %
Lymphocytes Relative: 14 %
Lymphs Abs: 1.6 10*3/uL (ref 0.7–4.0)
MCH: 30.1 pg (ref 26.0–34.0)
MCHC: 33.5 g/dL (ref 30.0–36.0)
MCV: 89.7 fL (ref 80.0–100.0)
Monocytes Absolute: 1.8 10*3/uL — ABNORMAL HIGH (ref 0.1–1.0)
Monocytes Relative: 16 %
Neutro Abs: 7.9 10*3/uL — ABNORMAL HIGH (ref 1.7–7.7)
Neutrophils Relative %: 66 %
Platelets: 286 10*3/uL (ref 150–400)
RBC: 5.25 MIL/uL (ref 4.22–5.81)
RDW: 12 % (ref 11.5–15.5)
WBC: 11.7 10*3/uL — ABNORMAL HIGH (ref 4.0–10.5)
nRBC: 0 % (ref 0.0–0.2)

## 2021-09-09 LAB — COMPREHENSIVE METABOLIC PANEL
ALT: 18 U/L (ref 0–44)
AST: 25 U/L (ref 15–41)
Albumin: 4 g/dL (ref 3.5–5.0)
Alkaline Phosphatase: 56 U/L (ref 38–126)
Anion gap: 7 (ref 5–15)
BUN: 20 mg/dL (ref 8–23)
CO2: 30 mmol/L (ref 22–32)
Calcium: 9.2 mg/dL (ref 8.9–10.3)
Chloride: 100 mmol/L (ref 98–111)
Creatinine, Ser: 1.08 mg/dL (ref 0.61–1.24)
GFR, Estimated: >60 mL/min (ref >60)
Glucose, Bld: 115 mg/dL — ABNORMAL HIGH (ref 70–99)
Potassium: 4.2 mmol/L (ref 3.5–5.1)
Sodium: 137 mmol/L (ref 135–145)
Total Bilirubin: 0.5 mg/dL (ref 0.3–1.2)
Total Protein: 7.6 g/dL (ref 6.5–8.1)

## 2021-09-09 LAB — BRAIN NATRIURETIC PEPTIDE: B Natriuretic Peptide: 70.3 pg/mL (ref 0.0–100.0)

## 2021-09-09 LAB — URIC ACID: Uric Acid, Serum: 8.2 mg/dL (ref 3.7–8.6)

## 2021-09-09 MED ORDER — HYDROCODONE-ACETAMINOPHEN 5-325 MG PO TABS: 1.0000 | ORAL_TABLET | Freq: Four times a day (QID) | ORAL | 0 refills | Status: DC | PRN

## 2021-09-09 MED ORDER — IBUPROFEN 600 MG PO TABS: 600.0000 mg | ORAL_TABLET | Freq: Once | ORAL | Status: DC

## 2021-09-09 MED ORDER — IBUPROFEN 800 MG PO TABS
800.0000 mg | ORAL_TABLET | Freq: Once | ORAL | Status: AC
  Administered 2021-09-09: 800 mg via ORAL

## 2021-09-09 MED ORDER — PREDNISONE 20 MG PO TABS: 40.0000 mg | ORAL_TABLET | Freq: Every day | ORAL | 0 refills | Status: AC

## 2021-09-09 MED ORDER — DOXYCYCLINE HYCLATE 100 MG PO CAPS: 100.0000 mg | ORAL_CAPSULE | Freq: Two times a day (BID) | ORAL | 0 refills | Status: AC

## 2021-09-09 MED ORDER — OXYCODONE-ACETAMINOPHEN 5-325 MG PO TABS
2.0000 | ORAL_TABLET | Freq: Once | ORAL | Status: AC
  Administered 2021-09-09: 2 via ORAL
  Filled 2021-09-09: qty 2

## 2021-09-09 MED ORDER — PREDNISONE 20 MG PO TABS
60.0000 mg | ORAL_TABLET | Freq: Once | ORAL | Status: AC
  Administered 2021-09-09: 60 mg via ORAL
  Filled 2021-09-09: qty 3

## 2021-09-09 MED ORDER — IBUPROFEN 800 MG PO TABS
ORAL_TABLET | ORAL | Status: AC
  Filled 2021-09-09: qty 1

## 2021-09-09 MED ORDER — ONDANSETRON 4 MG PO TBDP
4.0000 mg | ORAL_TABLET | Freq: Once | ORAL | Status: AC
  Administered 2021-09-09: 4 mg via ORAL
  Filled 2021-09-09: qty 1

## 2021-09-09 NOTE — Telephone Encounter (Signed)
Patient's wife is calling in asking for advice on what they need to do. Wanting to know if the cough medication is what is increasing the swelling in his feet. Asking if they need to do another visit or if there is something that can be recommended. Wife is concerned that she will not have help with [facility] as her son will be going out of town tomorrow afternoon. Is scheduled to see Jae Dire but wife is really concerned.

## 2021-09-09 NOTE — ED Triage Notes (Signed)
States had cough and congestion last week, took Mucinex DM x 1 week.  This past Monday, seen by PCP, started on Augmentin and codeine cough syrup MOnday night, feet started swelling  Noticed swelling to feet.  States painful to stand or walk with feet.  +1 swelling noted to feet bilaterally.

## 2021-09-09 NOTE — Telephone Encounter (Signed)
Please see note on other 09/09/21 pt message.

## 2021-09-09 NOTE — Telephone Encounter (Signed)
I saw patient's ER note.  Please get update on patient on Thursday.  Thanks.

## 2021-09-09 NOTE — ED Provider Triage Note (Signed)
  Emergency Medicine Provider Triage Evaluation Note  Soul ANSHUL MEDDINGS , a 76 y.o.male,  was evaluated in triage.  Pt complains of feet swelling/pain.  Patient reports cough and congestion for the past week which she is currently being treated with Augmentin and codeine cough syrup for.  Over the past few days, he has noticed swelling in his feet bilaterally, associated with significant pain while walking.   Review of Systems  Positive: Foot pain, cough, congestion Negative: Denies fever, chest pain, vomiting  Physical Exam   Vitals:   09/09/21 1507  BP: (!) 146/91  Pulse: 83  Resp: 18  Temp: 99.6 F (37.6 C)  SpO2: 97%   Gen:   Awake, no distress   Resp:  Normal effort  MSK:   Moves extremities without difficulty  Other:  Active cough and congestion.  Noticeable erythema in the feet bilaterally with mild swelling.  Medical Decision Making  Given the patient's initial medical screening exam, the following diagnostic evaluation has been ordered. The patient will be placed in the appropriate treatment space, once one is available, to complete the evaluation and treatment. I have discussed the plan of care with the patient and I have advised the patient that an ED physician or mid-level practitioner will reevaluate their condition after the test results have been received, as the results may give them additional insight into the type of treatment they may need.    Diagnostics: Labs, CXR  Treatments: Ibuprofen   Varney Daily, [place] 09/09/21 [phone]

## 2021-09-09 NOTE — ED Provider Notes (Signed)
[facility] Provider Note    Event Date/Time   First MD Initiated Contact with Patient 09/09/21 1556     (approximate)   History   No chief complaint on file.   HPI  Alan Castillo is a 76 y.o. male  here with swelling in bilateral feet since Monday. Started on L, then R. Around that time he went to his PCP b/c he had sinus congestion, sore throat, was started on Amoxicillin and cough syrup w/ codeine. Has been taking that as prescribed. Noticed swelling and now has pain w/ movement of foot, including at his ankle joint, toes. Worse w/ any movement. Pain and swelling increasing - aching, throbbing, sharp.   Of note, pt has no known h/o gout. Denies recent diet changes. He does take HCTZ. No recent surgeries. No h/o prior injuries to the area.      Physical Exam   Triage Vital Signs: ED Triage Vitals  Enc Vitals Group     BP 09/09/21 1507 (!) 146/91     Pulse Rate 09/09/21 1507 83     Resp 09/09/21 1507 18     Temp 09/09/21 1507 99.6 F (37.6 C)     Temp src --      SpO2 09/09/21 1507 97 %     Weight 09/09/21 1505 223 lb 12.3 oz (101.5 kg)     Height 09/09/21 1505 5\' 11"  (1.803 m)     Head Circumference --      Peak Flow --      Pain Score 09/09/21 1505 5     Pain Loc --      Pain Edu? --      Excl. in GC? --     Most recent vital signs: Vitals:   09/09/21 1507 09/09/21 1805  BP: (!) 146/91 132/81  Pulse: 83 77  Resp: 18 14  Temp: 99.6 F (37.6 C)   SpO2: 97% 96%     General: Awake, no distress.  CV:  Good peripheral perfusion. RRR. Resp:  Normal effort.  Abd:  No distention.  Other:  Bilateral significant warmth, mild swelling to MTP joints of both feet. Some mild swelling and tenderness extending proximally to the L midfoot. No overt ankle effusions. Distal NV is intact. No open wounds.   ED Results / Procedures / Treatments   Labs (all labs ordered are listed, but only abnormal results are displayed) Labs Reviewed  CBC  WITH DIFFERENTIAL/PLATELET - Abnormal; Notable for the following components:      Result Value   WBC 11.7 (*)    Neutro Abs 7.9 (*)    Monocytes Absolute 1.8 (*)    All other components within normal limits  COMPREHENSIVE METABOLIC PANEL - Abnormal; Notable for the following components:   Glucose, Bld 115 (*)    All other components within normal limits  URIC ACID  BRAIN NATRIURETIC PEPTIDE     EKG    RADIOLOGY CXR: No active disease DG Left: No acute fx, degen changes in the first MTP joint DG Right: Significant degen changes in the R first MTP join   I also independently reviewed and agree with radiologist interpretations.   PROCEDURES:  Critical Care performed: No   MEDICATIONS ORDERED IN ED: Medications  ibuprofen (ADVIL) tablet 800 mg (800 mg Oral See Procedure Record 09/09/21 1621)  predniSONE (DELTASONE) tablet 60 mg (60 mg Oral Given 09/09/21 1659)  ondansetron (ZOFRAN-ODT) disintegrating tablet 4 mg (4 mg Oral Given 09/09/21 1700)  oxyCODONE-acetaminophen (PERCOCET/ROXICET) 5-325 MG per tablet 2 tablet (2 tablets Oral Given 09/09/21 1658)     IMPRESSION / MDM / ASSESSMENT AND PLAN / ED COURSE  I reviewed the triage vital signs and the nursing notes.                               Ddx:  Differential includes the following, with pertinent life- or limb-threatening emergencies considered:  Acute gouty arthritis, inflammatory arthritis, osteoarthritis, cellulitis, unlikely DVT, unlikely CHF, tendinitis, bony injury  Patient's presentation is most consistent with acute illness / injury with system symptoms.  MDM:  76 year old well-appearing male here with bilateral foot pain.  Patient was just diagnosed with URI, started on Augmentin.  Clinically, patient's exam is highly consistent with likely acute gouty arthritis of the first MTP bilaterally.  While at somewhat atypical to be bilateral, I suspect this could be exacerbated in the setting of his recent illness,  hydrochlorothiazide use, and relative dehydration.  I have a low suspicion for a septic arthritis as the patient is otherwise very well-appearing, has no significant risk factors for this, and exam is more consistent with gout.  No apparent ankle or knee in involvement.  His swelling is localized primarily along the toe and midfoot, with no symptoms to suggest DVT, CHF, renal failure, or systemic illness.  I obtain plain films of the bilateral feet, which show significant degenerative changes of the right toe at the MTP also consistent with gout.  Differential includes pseudogout.  Lab work with minimal leukocytosis which is likely reactive from his URI.  Regarding his URI, he is not hypoxic and lungs are clear, no pneumonia noted on chest x-ray.  Uric acid 8.2.  CBC otherwise unremarkable.  CMP with normal renal function and LFTs.  BNP normal.  Patient feels markedly improved with analgesia in the ED and is able to ambulate.  Will treat for likely gouty arthritis with a course of prednisone.  Patient will also be started on doxycycline in place of his Augmentin in the event of possible skin infection, though this is significantly less likely clinically.  This should also cover for his possible atypical pneumonia.  Return precautions discussed in detail.   MEDICATIONS GIVEN IN ED: Medications  ibuprofen (ADVIL) tablet 800 mg (800 mg Oral See Procedure Record 09/09/21 1621)  predniSONE (DELTASONE) tablet 60 mg (60 mg Oral Given 09/09/21 1659)  ondansetron (ZOFRAN-ODT) disintegrating tablet 4 mg (4 mg Oral Given 09/09/21 1700)  oxyCODONE-acetaminophen (PERCOCET/ROXICET) 5-325 MG per tablet 2 tablet (2 tablets Oral Given 09/09/21 1658)     Consults:     EMR reviewed  Reviewed PCP notes, including visit with nurse practitioner on 6/5 for sinusitis     FINAL CLINICAL IMPRESSION(S) / ED DIAGNOSES   Final diagnoses:  Gouty arthritis  Foot pain, bilateral     Rx / DC Orders   ED Discharge Orders           Ordered    HYDROcodone-acetaminophen (NORCO/VICODIN) 5-325 MG tablet  Every 6 hours PRN        09/09/21 1815    predniSONE (DELTASONE) 20 MG tablet  Daily        09/09/21 1815    doxycycline (VIBRAMYCIN) 100 MG capsule  2 times daily        09/09/21 1815             Note:  This document was prepared using Dragon voice  recognition software and may include unintentional dictation errors.   Shaune Pollack, MD 09/09/21 [phone]

## 2021-09-09 NOTE — Telephone Encounter (Signed)
I spoke with pt; pt said he started the augmentin on 09/07/21 and the swelling and pain started in both feet on 09/07/21 during the night. On Tuesday the pain in both feet and swelling worsened and then today the swelling and pain in feet (rt foot slightly more swollen than lt foot) is so bad that pt cannot stand to bear wt on feet. Pt is using walker to slide feet along on floor; pt said cannot bend ankle due to extreme swelling in ankle. Pt said there are a couple of red spots on feet. Pt said no swelling in lower legs. Pt has no swelling in mouth, tongue or throat and no difficulty breathing. Pt has not taken augmentin today and per Eugenia Pancoast FNP instruction pt is going to hold augmentin until seen. Pt said he is not sure if could be a reaction to the cough med or not so pt is not going to take cough med until seen. No available appts at [facility] this afternoon and due to pain level and swelling pt is going to [facility] ED now by car. Pt did not cancel appt on 09/10/21 with Gentry Fitz NP incase pt needs to be seen in AM. Pt said if does not need appt he will call early AM to cancel. Sending note to Dr Damita Dunnings and Eugenia Pancoast FNP who are out of office this afternoon and will send to Gentry Fitz NP who is in office and Alyse Low CMA who is working with Anda Kraft this afternoon.

## 2021-09-09 NOTE — Discharge Instructions (Signed)
Please read the attached documents re: foods to avoid in gout flares  Take the prednisone as prescribed  Take Ibuprofen 600 mg every 6-8 hours for mild to moderate pain  Take the Norco as needed for severe pain. IF you are taking the pain medication regularly, take COLACE or other over the counter stool softener to prevent constipation.  STOP the HCTZ blood pressure medication for now, and call your doctor to set up an appointment for medication changes

## 2021-09-10 ENCOUNTER — Ambulatory Visit: Payer: Medicare Other | Admitting: Primary Care

## 2021-09-10 ENCOUNTER — Encounter: Payer: Self-pay | Admitting: Primary Care

## 2021-09-10 ENCOUNTER — Other Ambulatory Visit: Payer: Self-pay | Admitting: Primary Care

## 2021-09-10 DIAGNOSIS — M109 Gout, unspecified: Secondary | ICD-10-CM

## 2021-09-10 DIAGNOSIS — I10 Essential (primary) hypertension: Secondary | ICD-10-CM

## 2021-09-10 MED ORDER — COLCHICINE 0.6 MG PO TABS: ORAL_TABLET | ORAL | 0 refills | Status: DC

## 2021-09-10 MED ORDER — LOSARTAN POTASSIUM 25 MG PO TABS: 25.0000 mg | ORAL_TABLET | Freq: Every day | ORAL | 0 refills | Status: DC

## 2021-09-10 NOTE — Assessment & Plan Note (Addendum)
Bilaterally. Evident today.  Reviewed ED notes, labs, imaging.  Continue prednisone 40 mg daily, but I do believe he may need an extended course.  Rx for colchicine provided for future flare.   Remain off HCTZ 12.5 mg daily.  Start losartan 25 mg daily. He does not believe that he had a true rash with lisinopril.  Follow up with PCP in 1 week for evaluation of gout and hypertension.

## 2021-09-10 NOTE — Telephone Encounter (Signed)
Noted. I thank all involved.  ?

## 2021-09-10 NOTE — Progress Notes (Signed)
Subjective:    Patient ID: Alan Castillo, male    DOB: April 30, 1945, 76 y.o.   MRN: [id number]  HPI  Alan Castillo is a very pleasant 76 y.o. male patient of Dr. Para March with a history of non-recurrent sinusitis, hypertension, BPH, hyperlipidemia, OSA who presents today to discuss pedal edema.  Evaluated at [facility] ED yesterday for bilateral pedal edema that began two days prior. Prior to his ED visit he was treated by Wyatt Mage, NP for acute sinusitis, prescribed Augmentin, cough syrup with codeine. His swelling began shortly after.   During his ED visit he underwent labs which revealed mild leukocytosis, uric acid level of 8.2, negative CBC. Xray of both feet were without fracture. He was treated for gouty arthritis with a course of prednisone. He was also treated for a possible skin infection, switched to Doxycycline. He was discharged home later.   Since his ED visit his cough has subsided. He took his first dose of Doxycycline and prednisone this morning. He did notice improvement in swelling yesterday when he left the ED but his swelling has slightly returned. 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   He can recall various episodes of foot pain over the years, overall very mild, resolved with a dose of two of Ibuprofen. He stopped his HCTZ per ED physician recommendations. He did not take this morning.   He does mention eating beef liver frequently about 1 month ago. He does not drink alcohol.   BP Readings from Last 3 Encounters:  09/10/21 110/62  09/09/21 132/81  09/07/21 122/80      Review of Systems  Constitutional:  Negative for fever.  Musculoskeletal:  Positive for arthralgias and joint swelling.  Skin:  Positive for color change. Negative for wound.         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)    Employer: [employer]  Tobacco Use   Smoking status: Never    Passive exposure: Past   Smokeless tobacco: Never  Substance and Sexual Activity   Alcohol use: No    Alcohol/week: 0.0 standard drinks of alcohol   Drug use: No   Sexual activity: Yes  Other Topics Concern   Not on file  Social History Narrative   Married 1968, lives with wife   [place] [employer] [place]   3 kids, 2 are Programmer, systems   Social Determinants of Health   Financial Resource Strain: Not on file  Food Insecurity: Not on file  Transportation Needs: Not on file  Physical Activity: Not on file  Stress: Not on file  Social Connections: Not on file  Intimate Partner Violence: Not on file    Past Surgical History:  Procedure Laterality Date   APPENDECTOMY     CATARACT EXTRACTION W/PHACO Right 02/18/2021   Procedure: CATARACT EXTRACTION PHACO AND INTRAOCULAR LENS PLACEMENT (IOC) RIGHT 13.00 01:19.4;  Surgeon: Lockie Mola, MD;  Location[facility];  Service: Ophthalmology;  Laterality: Right;   CATARACT EXTRACTION W/PHACO Left 03/04/2021   Procedure: CATARACT EXTRACTION PHACO AND INTRAOCULAR LENS PLACEMENT (IOC) LEFT EYHANCE toric lens 5.67 00:53.6;  Surgeon: Lockie MolaBrasington, Chadwick, MD;  [facility];  Service: Ophthalmology;  Laterality: Left;   Left knee surgery  05/06/2009   L med meniscal repair (Dr. Thomasena Edisollins)   rectal fistula plug  01/02/2007   [facility] (Dr. Lolita PatellaManth)   SHOULDER SURGERY  04/06/2011   L rotator cuff repair    Family History  Problem Relation Age of Onset   Hypothyroidism Mother    Heart disease Mother        CHF, MI   Cancer Father        prostate,  lung   Prostate cancer Father    Hypertension Sister    Stroke Maternal Grandfather    Colon cancer Neg Hx     Allergies  Allergen Reactions   Amlodipine Other (See Comments)    Rash   Lisinopril     Possible rash    Current Outpatient Medications on File Prior to Visit  Medication Sig Dispense Refill   doxycycline (VIBRAMYCIN) 100 MG capsule Take 1 capsule (100 mg total) by mouth 2 (two) times daily for 7 days. 14 capsule 0   HYDROcodone-acetaminophen (NORCO/VICODIN) 5-325 MG tablet Take 1-2 tablets by mouth every 6 (six) hours as needed for moderate pain or severe pain (no more than 6 tabs daily). 15 tablet 0   predniSONE (DELTASONE) 20 MG tablet Take 2 tablets (40 mg total) by mouth daily for 5 days. 10 tablet 0   sildenafil (REVATIO) 20 MG tablet Take 1-5 tablets (20-100 mg total) by mouth daily as needed. 50 tablet 12   triamcinolone ointment (KENALOG) 0.1 % Apply 1 application topically 2 (two) times daily as needed.     guaiFENesin-codeine (ROBITUSSIN AC) 100-10 MG/5ML syrup Take 5 mLs by mouth 3 (three) times daily as needed for up to 5 days for cough. (Patient not taking: Reported on 09/10/2021) 75 mL 0   No current facility-administered medications on file prior to visit.    BP 110/62   Pulse 77   Temp 98.6 F (37 C) (Oral)   Ht 5\' 11"  (1.803 m)   Wt 221 lb (100.2 kg)   SpO2 97%   BMI 30.82 kg/m  Objective:   Physical Exam Cardiovascular:     Rate and Rhythm: Normal rate and regular rhythm.  Pulmonary:     Effort: Pulmonary effort is normal.     Breath sounds: Normal breath sounds. No wheezing or rales.     Comments: Dry cough noted.  Musculoskeletal:     Cervical back: Neck supple.     Right foot: Decreased range of motion. Swelling present. No tenderness. Normal pulse.     Left foot: Decreased range of motion. Swelling and bunion present. No tenderness. Normal pulse.     Comments: Bilateral pedal edema noted, left worse than right.  Mild erythema and  decrease in ROM, greatest at left first metatarsal joint  Skin:    General: Skin is warm and dry.  Neurological:     Mental Status: He is alert and oriented to person, place, and time.           Assessment & Plan:   Problem List Items Addressed This Visit       Cardiovascular and Mediastinum   Essential (primary) hypertension    HCTZ discontinued due to gout flare.  Remain  off HCTZ. Start losartan 25 mg daily.   He endorses that he had no true rash with lisinopril. He will closely monitor.  Close follow up with PCP next week.       Relevant Medications   losartan (COZAAR) 25 MG tablet     Other   Acute gout    Bilaterally. Evident today.  Reviewed ED notes, labs, imaging.  Continue prednisone 40 mg daily, but I do believe he may need an extended course.  Rx for colchicine provided for future flare.   Remain off HCTZ 12.5 mg daily.  Start losartan 25 mg daily. He does not believe that he had a true rash with lisinopril.  Follow up with PCP in 1 week for evaluation of gout and hypertension.      Relevant Medications   colchicine 0.6 MG tablet       Doreene Nest, NP

## 2021-09-10 NOTE — Patient Instructions (Addendum)
Start losartan 25 mg once daily for blood pressure.   Remain off of hydrochlorothiazide.  Continue prednisone, 2 tablets daily for gout.   You can take the colchicine as needed for a future flare.  Follow up with Dr. Para March as scheduled.   It was a pleasure meeting you!  Gout  Gout is a condition that causes painful swelling of the joints. Gout is a type of inflammation of the joints (arthritis). This condition is caused by having too much uric acid in the body. Uric acid is a chemical that forms when the body breaks down substances called purines. Purines are important for building body proteins. When the body has too much uric acid, sharp crystals can form and build up inside the joints. This causes pain and swelling. Gout attacks can happen quickly and may be very painful (acute gout). Over time, the attacks can affect more joints and become more frequent (chronic gout). Gout can also cause uric acid to build up under the skin and inside the kidneys. What are the causes? This condition is caused by too much uric acid in your blood. This can happen because: Your kidneys do not remove enough uric acid from your blood. This is the most common cause. Your body makes too much uric acid. This can happen with some cancers and cancer treatments. It can also occur if your body is breaking down too many red blood cells (hemolytic anemia). You eat too many foods that are high in purines. These foods include organ meats and some seafood. Alcohol, especially beer, is also high in purines. A gout attack may be triggered by trauma or stress. What increases the risk? The following factors may make you more likely to develop this condition: Having a family history of gout. Being male and middle-aged. Being male and having gone through menopause. Taking certain medicines, including aspirin, cyclosporine, diuretics, levodopa, and niacin. Having an organ transplant. Having certain conditions, such  as: Being obese. Lead poisoning. Kidney disease. A skin condition called psoriasis. Other factors include: Losing weight too quickly. Being dehydrated. Frequently drinking alcohol, especially beer. Frequently drinking beverages that are sweetened with a type of sugar called fructose. What are the signs or symptoms? An attack of acute gout happens quickly. It usually occurs in just one joint. The most common place is the big toe. Attacks often start at night. Other joints that may be affected include joints of the feet, ankle, knee, fingers, wrist, or elbow. Symptoms of this condition may include: Severe pain. Warmth. Swelling. Stiffness. Tenderness. The affected joint may be very painful to touch. Shiny, red, or purple skin. Chills and fever. Chronic gout may cause symptoms more frequently. More joints may be involved. You may also have white or yellow lumps (tophi) on your hands or feet or in other areas near your joints. How is this diagnosed? This condition is diagnosed based on your symptoms, your medical history, and a physical exam. You may have tests, such as: Blood tests to measure uric acid levels. Removal of joint fluid with a thin needle (aspiration) to look for uric acid crystals. X-rays to look for joint damage. How is this treated? Treatment for this condition has two phases: treating an acute attack and preventing future attacks. Acute gout treatment may include medicines to reduce pain and swelling, including: NSAIDs, such as ibuprofen. Steroids. These are strong anti-inflammatory medicines that can be taken by mouth (orally) or injected into a joint. Colchicine. This medicine relieves pain and swelling when it  is taken soon after an attack. It can be given by mouth or through an IV. Preventive treatment may include: Daily use of smaller doses of NSAIDs or colchicine. Use of a medicine that reduces uric acid levels in your blood, such as allopurinol. Changes to your  diet. You may need to see a dietitian about what to eat and drink to prevent gout. Follow these instructions at home: During a gout attack  If directed, put ice on the affected area. To do this: Put ice in a plastic bag. Place a towel between your skin and the bag. Leave the ice on for 20 minutes, 2-3 times a day. Remove the ice if your skin turns bright red. This is very important. If you cannot feel pain, heat, or cold, you have a greater risk of damage to the area. Raise (elevate) the affected joint above the level of your heart as often as possible. Rest the joint as much as possible. If the affected joint is in your leg, you may be given crutches to use. Follow instructions from your health care provider about eating or drinking restrictions. Avoiding future gout attacks Follow a low-purine diet as told by your dietitian or health care provider. Avoid foods and drinks that are high in purines, including liver, kidney, anchovies, asparagus, herring, mushrooms, mussels, and beer. Maintain a healthy weight or lose weight if you are overweight. If you want to lose weight, talk with your health care provider. Do not lose weight too quickly. Start or maintain an exercise program as told by your health care provider. Eating and drinking Avoid drinking beverages that contain fructose. Drink enough fluids to keep your urine pale yellow. If you drink alcohol: Limit how much you have to: 0-1 drink a day for women who are not pregnant. 0-2 drinks a day for men. Know how much alcohol is in a drink. In the [place] one drink equals one 12 oz bottle of beer (355 mL), one 5 oz glass of wine (148 mL), or one 1 oz glass of hard liquor (44 mL). General instructions Take over-the-counter and prescription medicines only as told by your health care provider. Ask your health care provider if the medicine prescribed to you requires you to avoid driving or using machinery. Return to your normal activities as  told by your health care provider. Ask your health care provider what activities are safe for you. Keep all follow-up visits. This is important. Where to find more information [employer]: www.niams.http://www.myers.net/ Contact a health care provider if you have: Another gout attack. Continuing symptoms of a gout attack after 10 days of treatment. Side effects from your medicines. Chills or a fever. Burning pain when you urinate. Pain in your lower back or abdomen. Get help right away if you: Have severe or uncontrolled pain. Cannot urinate. Summary Gout is painful swelling of the joints caused by having too much uric acid in the body. The most common site for gout to occur is in the big toe, but it can affect other joints in the body. Medicines and dietary changes can help to prevent and treat gout attacks. This information is not intended to replace advice given to you by your health care provider. Make sure you discuss any questions you have with your health care provider. Document Revised: 12/24/2020 Document Reviewed: 12/24/2020 Elsevier Patient Education  2023 [employer].

## 2021-09-10 NOTE — Assessment & Plan Note (Signed)
HCTZ discontinued due to gout flare.  Remain off HCTZ. Start losartan 25 mg daily.   He endorses that he had no true rash with lisinopril. He will closely monitor.  Close follow up with PCP next week.

## 2021-09-10 NOTE — Telephone Encounter (Signed)
Spoke with patient. Patient was seen by Mayra Reel today 09/10/21 for follow up, patient states improving slowly. Patient has follow up with Dr Para March on 09/17/21

## 2021-09-10 NOTE — Telephone Encounter (Signed)
Patient evaluated today

## 2021-09-11 NOTE — Telephone Encounter (Signed)
noted 

## 2021-09-17 ENCOUNTER — Ambulatory Visit: Payer: Medicare Other | Admitting: Family Medicine

## 2021-09-17 ENCOUNTER — Encounter: Payer: Self-pay | Admitting: Family Medicine

## 2021-09-17 DIAGNOSIS — M109 Gout, unspecified: Secondary | ICD-10-CM

## 2021-09-17 MED ORDER — IBUPROFEN 200 MG PO TABS: 400.0000 mg | ORAL_TABLET | Freq: Three times a day (TID) | ORAL | Status: AC | PRN

## 2021-09-17 MED ORDER — HYDROCODONE-ACETAMINOPHEN 5-325 MG PO TABS: 1.0000 | ORAL_TABLET | Freq: Four times a day (QID) | ORAL | 0 refills | Status: DC | PRN

## 2021-09-17 NOTE — Patient Instructions (Addendum)
Ibuprofen with food.  Drink enough water to keep your urine clear.  Start colchicine today.  2 tabs initially then another tab 2 hours later.  You can take a 4th tab later today if no diarrhea.  Then take colchicine daily if needed.  Update me tomorrow.  Take care.  Glad to see you. Stay off HCTZ in the meantime.

## 2021-09-17 NOTE — Progress Notes (Unsigned)
L>R 1st MTP pain and swelling.    In retrospect he had some occ L 1st MTP irritation but nothing like this.  Off HCTZ now.    Sx started with URI sx/cough.    Still in pain and no help with prednisone.  Hydrocodone didn't help much.  Ibuprofen helps some.  Hasn't used colchicine yet.    FH gout noted.    Meds, vitals, and allergies reviewed.   ROS: Per HPI unless specifically indicated in ROS section   L>R 1st MTP pain and swelling.   Ankles are still puffy.  L ankle ROM improved in the meantime.    4/10 pain now, at rest in a chair.

## 2021-09-18 ENCOUNTER — Encounter: Payer: Self-pay | Admitting: Family Medicine

## 2021-09-18 NOTE — Telephone Encounter (Signed)
Called pt.  Thanked him for the update.  I would continue as is for now with ibuprofen and colchicine and he'll update me next week.  I thanked him for taking the call.

## 2021-09-20 NOTE — Assessment & Plan Note (Signed)
Discussed options.  Would not start prophylactic medications in the midst of a flare of Reasonable to use ibuprofen with food.  Advised to drink enough water to keep his urine clear.  Start colchicine today.  2 tabs initially then another tab 2 hours later.  He can take a 4th tab later today if no diarrhea.  Then take colchicine daily if needed.  Update me tomorrow.  Stay off HCTZ in the meantime.  He agrees with plan.

## 2021-09-22 ENCOUNTER — Encounter: Payer: Self-pay | Admitting: Family Medicine

## 2021-10-05 ENCOUNTER — Other Ambulatory Visit: Payer: Self-pay | Admitting: Primary Care

## 2021-10-05 ENCOUNTER — Other Ambulatory Visit: Payer: Self-pay | Admitting: Family Medicine

## 2021-10-05 DIAGNOSIS — I10 Essential (primary) hypertension: Secondary | ICD-10-CM

## 2021-10-05 MED ORDER — LOSARTAN POTASSIUM 25 MG PO TABS: 25.0000 mg | ORAL_TABLET | Freq: Every day | ORAL | 1 refills | Status: DC

## 2021-10-05 NOTE — Telephone Encounter (Signed)
Prev sent.  Thanks.  

## 2021-10-29 ENCOUNTER — Other Ambulatory Visit: Payer: Self-pay

## 2021-10-29 DIAGNOSIS — M109 Gout, unspecified: Secondary | ICD-10-CM

## 2021-10-29 MED ORDER — COLCHICINE 0.6 MG PO TABS: ORAL_TABLET | ORAL | 0 refills | Status: DC

## 2021-11-02 ENCOUNTER — Other Ambulatory Visit: Payer: Self-pay | Admitting: Family Medicine

## 2021-11-02 DIAGNOSIS — M109 Gout, unspecified: Secondary | ICD-10-CM

## 2021-11-02 MED ORDER — COLCHICINE 0.6 MG PO TABS: ORAL_TABLET | ORAL | 2 refills | Status: DC

## 2021-11-02 MED ORDER — ALLOPURINOL 100 MG PO TABS: 100.0000 mg | ORAL_TABLET | Freq: Every day | ORAL | 6 refills | Status: DC

## 2021-11-06 DIAGNOSIS — H26492 Other secondary cataract, left eye: Secondary | ICD-10-CM | POA: Diagnosis not present

## 2021-12-11 ENCOUNTER — Other Ambulatory Visit: Payer: Medicare Other

## 2021-12-11 DIAGNOSIS — H26492 Other secondary cataract, left eye: Secondary | ICD-10-CM | POA: Diagnosis not present

## 2021-12-31 DIAGNOSIS — L249 Irritant contact dermatitis, unspecified cause: Secondary | ICD-10-CM | POA: Diagnosis not present

## 2021-12-31 DIAGNOSIS — D3701 Neoplasm of uncertain behavior of lip: Secondary | ICD-10-CM | POA: Diagnosis not present

## 2022-02-11 ENCOUNTER — Telehealth: Payer: Self-pay | Admitting: Family Medicine

## 2022-02-11 NOTE — Telephone Encounter (Signed)
LVM for pt to rtn my call to schedule AWV with [facility] call back # [phone] 

## 2022-02-16 ENCOUNTER — Telehealth: Payer: Self-pay | Admitting: Family Medicine

## 2022-02-16 NOTE — Telephone Encounter (Signed)
[facility], will have will have patient rtn my call to schedule AWV with [facility].

## 2022-03-01 ENCOUNTER — Other Ambulatory Visit (INDEPENDENT_AMBULATORY_CARE_PROVIDER_SITE_OTHER): Payer: Medicare Other

## 2022-03-01 DIAGNOSIS — M109 Gout, unspecified: Secondary | ICD-10-CM

## 2022-03-01 LAB — BASIC METABOLIC PANEL
BUN: 18 mg/dL (ref 6–23)
CO2: 27 mEq/L (ref 19–32)
Calcium: 9.1 mg/dL (ref 8.4–10.5)
Chloride: 103 mEq/L (ref 96–112)
Creatinine, Ser: 1.24 mg/dL (ref 0.40–1.50)
GFR: 56.49 mL/min — ABNORMAL LOW (ref >60.00)
Glucose, Bld: 103 mg/dL — ABNORMAL HIGH (ref 70–99)
Potassium: 4.2 mEq/L (ref 3.5–5.1)
Sodium: 138 mEq/L (ref 135–145)

## 2022-03-01 LAB — URIC ACID: Uric Acid, Serum: 7.9 mg/dL — ABNORMAL HIGH (ref 4.0–7.8)

## 2022-03-03 ENCOUNTER — Ambulatory Visit (INDEPENDENT_AMBULATORY_CARE_PROVIDER_SITE_OTHER): Payer: Medicare Other

## 2022-03-03 ENCOUNTER — Telehealth: Payer: Self-pay

## 2022-03-03 VITALS — Ht 71.0 in | Wt 220.0 lb

## 2022-03-03 DIAGNOSIS — Z0000 Encounter for general adult medical examination without abnormal findings: Secondary | ICD-10-CM | POA: Diagnosis not present

## 2022-03-03 NOTE — Patient Instructions (Addendum)
[name] , Thank you for taking time to come for your Medicare Wellness Visit. I appreciate your ongoing commitment to your health goals. Please review the following plan we discussed and let me know if I can assist you in the future.   These are the goals we discussed:  Goals      Maintain Healthy Lifestyle     Stay active Healthy diet         This is a list of the screening recommended for you and due dates:  Health Maintenance  Topic Date Due   Flu Shot  03/04/2022*   COVID-19 Vaccine (3 - 2023-24 season) 03/19/2022*   Zoster (Shingles) Vaccine (1 of 2) 06/03/2022*   Medicare Annual Wellness Visit  03/04/2023   Pneumonia Vaccine  Completed   Hepatitis C Screening: USPSTF Recommendation to screen - Ages 18-79 yo.  Completed   HPV Vaccine  Aged Out   Cologuard (Stool DNA test)  Discontinued  *Topic was postponed. The date shown is not the original due date.   Conditions/risks identified: none new  Next appointment: Follow up in one year for your annual wellness visit.   Preventive Care 76 Years and Older, Male  Preventive care refers to lifestyle choices and visits with your health care provider that can promote health and wellness. What does preventive care include? A yearly physical exam. This is also called an annual well check. Dental exams once or twice a year. Routine eye exams. Ask your health care provider how often you should have your eyes checked. Personal lifestyle choices, including: Daily care of your teeth and gums. Regular physical activity. Eating a healthy diet. Avoiding tobacco and drug use. Limiting alcohol use. Practicing safe sex. Taking low doses of aspirin every day. Taking vitamin and mineral supplements as recommended by your health care provider. What happens during an annual well check? The services and screenings done by your health care provider during your annual well check will depend on your age, overall health, lifestyle risk  factors, and family history of disease. Counseling  Your health care provider may ask you questions about your: Alcohol use. Tobacco use. Drug use. Emotional well-being. Home and relationship well-being. Sexual activity. Eating habits. History of falls. Memory and ability to understand (cognition). Work and work Astronomer. Screening  You may have the following tests or measurements: Height, weight, and BMI. Blood pressure. Lipid and cholesterol levels. These may be checked every 5 years, or more frequently if you are over 74 years old. Skin check. Lung cancer screening. You may have this screening every year starting at age 83 if you have a 30-pack-year history of smoking and currently smoke or have quit within the past 15 years. Fecal occult blood test (FOBT) of the stool. You may have this test every year starting at age 35. Flexible sigmoidoscopy or colonoscopy. You may have a sigmoidoscopy every 5 years or a colonoscopy every 10 years starting at age 15. Prostate cancer screening. Recommendations will vary depending on your family history and other risks. Hepatitis C blood test. Hepatitis B blood test. Sexually transmitted disease (STD) testing. Diabetes screening. This is done by checking your blood sugar (glucose) after you have not eaten for a while (fasting). You may have this done every 1-3 years. Abdominal aortic aneurysm (AAA) screening. You may need this if you are a current or former smoker. Osteoporosis. You may be screened starting at age 17 if you are at high risk. Talk with your health care provider about  your test results, treatment options, and if necessary, the need for more tests. Vaccines  Your health care provider may recommend certain vaccines, such as: Influenza vaccine. This is recommended every year. Tetanus, diphtheria, and acellular pertussis (Tdap, Td) vaccine. You may need a Td booster every 10 years. Zoster vaccine. You may need this after age  31. Pneumococcal 13-valent conjugate (PCV13) vaccine. One dose is recommended after age 45. Pneumococcal polysaccharide (PPSV23) vaccine. One dose is recommended after age 35. Talk to your health care provider about which screenings and vaccines you need and how often you need them. This information is not intended to replace advice given to you by your health care provider. Make sure you discuss any questions you have with your health care provider. Document Released: 04/18/2015 Document Revised: 12/10/2015 Document Reviewed: 01/21/2015 Elsevier Interactive Patient Education  2017 [place] Prevention in the Home Falls can cause injuries. They can happen to people of all ages. There are many things you can do to make your home safe and to help prevent falls. What can I do on the outside of my home? Regularly fix the edges of walkways and driveways and fix any cracks. Remove anything that might make you trip as you walk through a door, such as a raised step or threshold. Trim any bushes or trees on the path to your home. Use bright outdoor lighting. Clear any walking paths of anything that might make someone trip, such as rocks or tools. Regularly check to see if handrails are loose or broken. Make sure that both sides of any steps have handrails. Any raised decks and porches should have guardrails on the edges. Have any leaves, snow, or ice cleared regularly. Use sand or salt on walking paths during winter. Clean up any spills in your garage right away. This includes oil or grease spills. What can I do in the bathroom? Use night lights. Install grab bars by the toilet and in the tub and shower. Do not use towel bars as grab bars. Use non-skid mats or decals in the tub or shower. If you need to sit down in the shower, use a plastic, non-slip stool. Keep the floor dry. Clean up any water that spills on the floor as soon as it happens. Remove soap buildup in the tub or shower  regularly. Attach bath mats securely with double-sided non-slip rug tape. Do not have throw rugs and other things on the floor that can make you trip. What can I do in the bedroom? Use night lights. Make sure that you have a light by your bed that is easy to reach. Do not use any sheets or blankets that are too big for your bed. They should not hang down onto the floor. Have a firm chair that has side arms. You can use this for support while you get dressed. Do not have throw rugs and other things on the floor that can make you trip. What can I do in the kitchen? Clean up any spills right away. Avoid walking on wet floors. Keep items that you use a lot in easy-to-reach places. If you need to reach something above you, use a strong step stool that has a grab bar. Keep electrical cords out of the way. Do not use floor polish or wax that makes floors slippery. If you must use wax, use non-skid floor wax. Do not have throw rugs and other things on the floor that can make you trip. What can I do with  my stairs? Do not leave any items on the stairs. Make sure that there are handrails on both sides of the stairs and use them. Fix handrails that are broken or loose. Make sure that handrails are as long as the stairways. Check any carpeting to make sure that it is firmly attached to the stairs. Fix any carpet that is loose or worn. Avoid having throw rugs at the top or bottom of the stairs. If you do have throw rugs, attach them to the floor with carpet tape. Make sure that you have a light switch at the top of the stairs and the bottom of the stairs. If you do not have them, ask someone to add them for you. What else can I do to help prevent falls? Wear shoes that: Do not have high heels. Have rubber bottoms. Are comfortable and fit you well. Are closed at the toe. Do not wear sandals. If you use a stepladder: Make sure that it is fully opened. Do not climb a closed stepladder. Make sure that  both sides of the stepladder are locked into place. Ask someone to hold it for you, if possible. Clearly mark and make sure that you can see: Any grab bars or handrails. First and last steps. Where the edge of each step is. Use tools that help you move around (mobility aids) if they are needed. These include: Canes. Walkers. Scooters. Crutches. Turn on the lights when you go into a dark area. Replace any light bulbs as soon as they burn out. Set up your furniture so you have a clear path. Avoid moving your furniture around. If any of your floors are uneven, fix them. If there are any pets around you, be aware of where they are. Review your medicines with your doctor. Some medicines can make you feel dizzy. This can increase your chance of falling. Ask your doctor what other things that you can do to help prevent falls. This information is not intended to replace advice given to you by your health care provider. Make sure you discuss any questions you have with your health care provider. Document Released: 01/16/2009 Document Revised: 08/28/2015 Document Reviewed: 04/26/2014 Elsevier Interactive Patient Education  2017 [employer].

## 2022-03-03 NOTE — Telephone Encounter (Signed)
Unable to reach patient for scheduled AWV. No answer. Phones does not ring but goes straight to voicemail. Left message to call the office back. Okay to reschedule.

## 2022-03-03 NOTE — Progress Notes (Signed)
Subjective:   Alan Castillo is a 76 y.o. male who presents for Medicare Annual/Subsequent preventive examination.  Review of Systems    No ROS.  Medicare Wellness Virtual Visit.  Visual/audio telehealth visit, UTA vital signs.   See social history for additional risk factors.   Cardiac Risk Factors include: advanced age (>33men, >79 women);male gender;diabetes mellitus     Objective:    Today's Vitals   03/03/22 1320  Weight: 220 lb (99.8 kg)  Height: 5\' 11"  (1.803 m)   Body mass index is 30.68 kg/m.     03/03/2022    1:36 PM 09/09/2021    3:06 PM 03/04/2021    9:01 AM 02/23/2017    5:20 PM  Advanced Directives  Does Patient Have a Medical Advance Directive? Yes No Yes Yes  Type of Scientist, physiological of [place];Living will [place];Living will  Does patient want to make changes to medical advance directive?   No - Guardian declined   Copy of [place] in Chart?   Yes - validated most recent copy scanned in chart (See row information)   Would patient like information on creating a medical advance directive?  No - Patient declined No - Patient declined     Current Medications (verified) Outpatient Encounter Medications as of 03/03/2022  Medication Sig   allopurinol (ZYLOPRIM) 100 MG tablet Take 1 tablet (100 mg total) by mouth daily. (Patient not taking: Reported on 03/03/2022)   colchicine 0.6 MG tablet Take 2 tablets by mouth at gout onset, repeat with 1 tablet 2 hours later. Then take 1 tablet daily until gout flare resolves.   HYDROcodone-acetaminophen (NORCO/VICODIN) 5-325 MG tablet Take 1-2 tablets by mouth every 6 (six) hours as needed for moderate pain or severe pain (no more than 6 tabs daily). (Patient not taking: Reported on 03/03/2022)   ibuprofen (ADVIL) 200 MG tablet Take 2-3 tablets (400-600 mg total) by mouth every 8 (eight) hours as needed (with food.).   losartan (COZAAR) 25 MG tablet Take 1  tablet (25 mg total) by mouth daily. for blood pressure.   sildenafil (REVATIO) 20 MG tablet Take 1-5 tablets (20-100 mg total) by mouth daily as needed.   triamcinolone ointment (KENALOG) 0.1 % Apply 1 application topically 2 (two) times daily as needed.   No facility-administered encounter medications on file as of 03/03/2022.    Allergies (verified) Amlodipine, Hydrochlorothiazide, and Lisinopril   History: Past Medical History:  Diagnosis Date   BPH (benign prostatic hyperplasia)    GERD (gastroesophageal reflux disease)    Hypertension    Sleep apnea    wears a dental device   Vertigo    Past Surgical History:  Procedure Laterality Date   APPENDECTOMY     CATARACT EXTRACTION W/PHACO Right 02/18/2021   Procedure: CATARACT EXTRACTION PHACO AND INTRAOCULAR LENS PLACEMENT ([place]) RIGHT 13.00 01:19.4;  Surgeon: Leandrew Koyanagi, MD;  Location: [facility];  Service: Ophthalmology;  Laterality: Right;   CATARACT EXTRACTION W/PHACO Left 03/04/2021   Procedure: CATARACT EXTRACTION PHACO AND INTRAOCULAR LENS PLACEMENT ([place]) LEFT EYHANCE toric lens 5.67 00:53.6;  Surgeon: Leandrew Koyanagi, MD;  Location: [facility];  Service: Ophthalmology;  Laterality: Left;   Left knee surgery  05/06/2009   L med meniscal repair (Dr. Theda Sers)   rectal fistula plug  01/02/2007   [facility] (Dr. 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)    Employer: [employer]  Tobacco Use   Smoking status: Never    Passive exposure: Past   Smokeless tobacco: Never  Substance and Sexual Activity   Alcohol use: No    Alcohol/week: 0.0 standard drinks of alcohol   Drug use: No   Sexual activity: Yes  Other Topics Concern   Not on file  Social History Narrative   Married 1968, lives with wife   BS [place]   3 kids, 2 are Forensic psychologist   Social Determinants of Health   Financial Resource Strain: [place]  (03/03/2022)   Overall Financial Resource Strain (CARDIA)    Difficulty of Paying Living Expenses: Not hard at all  Food Insecurity: No Food Insecurity (03/03/2022)   Hunger Vital Sign    Worried About Running Out of Food in the Last Year: Never true    [place] in the Last Year: Never true  Transportation Needs: No Transportation Needs (03/03/2022)   PRAPARE - Hydrologist (Medical): No    Lack of Transportation (Non-Medical): No  Physical Activity: Sufficiently Active (03/03/2022)   Exercise Vital Sign    Days of Exercise per Week: 7 days    Minutes of Exercise per Session: 120 min  Stress: No Stress Concern Present (03/03/2022)   [place]    Feeling of Stress : Not at all  Social Connections: Unknown (03/03/2022)   Social Connection and Isolation Panel [NHANES]    Frequency of Communication with Friends and Family: More than three times a week    Frequency of Social Gatherings with Friends and Family: More than three times a week    Attends Religious Services: Not on Advertising copywriter or Organizations: Not on file    Attends Archivist Meetings: Not on file    Marital Status: Not on file    Tobacco Counseling Counseling given: Not Answered   Clinical Intake:  Pre-visit preparation completed: Yes        Diabetes: No  How often do you  need to have someone help you when you read instructions, pamphlets, or other written materials from your doctor or pharmacy?: 1 - Never    Interpreter Needed?: No      Activities of Daily Living    03/03/2022    1:23 PM 03/04/2021    9:03 AM  In your present state of health, do you have any difficulty performing the following activities:  Hearing? 0 0  Vision? 0 0  Difficulty concentrating or making decisions? 0 0  Walking or climbing stairs? 0 0  Dressing or bathing? 0 0  Doing errands, shopping? 0   Preparing Food and eating ? N  Using the Toilet? N   In the past six months, have you accidently leaked urine? N   Do you have problems with loss of bowel control? N   Managing your Medications? N   Managing your Finances? N   Housekeeping or managing your Housekeeping? N     Patient Care Team: Tonia Ghent, MD as PCP - General (Family Medicine)  Indicate any recent Medical Services you may have received from other than [facility] providers in the past year (date may be approximate).     Assessment:   This is a routine wellness examination for [place].  I connected with  Kasra D Billiot on 03/03/22 by a audio enabled telemedicine application and verified that I am speaking with the correct person using two identifiers.  Patient Location: Home  Provider Location: Office/Clinic  I discussed the limitations of evaluation and management by telemedicine. The patient expressed understanding and agreed to proceed.   Hearing/Vision screen Hearing Screening - Comments:: Patient reports difficulty hearing high pitch tones. Does not wear hearing aids.  Vision Screening - Comments:: Followed by [facility] Cataract extraction, bilateral They have seen their ophthalmologist in the last 12 months.    Dietary issues and exercise activities discussed: Current Exercise Habits: Home exercise routine, Type of exercise: strength training/weights;walking;calisthenics (dancing,  golfing, glider), Time (Minutes): > 60, Frequency (Times/Week): 7, Weekly Exercise (Minutes/Week): 0, Intensity: Moderate Healthy diet No shell fish, red meat or high fructose corn syrup Good water intake   Goals Addressed             This Visit's Progress    Maintain Healthy Lifestyle       Stay active Healthy diet        Depression Screen    03/03/2022    1:32 PM 03/02/2021   11:52 AM 01/18/2019   12:02 PM 09/01/2017    3:24 PM 07/12/2016    9:57 AM 07/07/2015    9:03 AM 07/04/2014    9:49 AM  PHQ 2/9 Scores  PHQ - 2 Score 0 0 0 0 0 0 0    Fall Risk    03/03/2022    1:23 PM 09/18/2020   10:36 AM 01/18/2019   12:02 PM 09/01/2017    3:24 PM 07/12/2016    9:57 AM  [place] in the past year? 0 1 0 No No  Number falls in past yr: 0 0 0    Injury with Fall? 0 0 0    Risk for fall due to : No Fall Risks      Follow up Falls evaluation completed;Falls prevention discussed Falls evaluation completed       FALL RISK PREVENTION PERTAINING TO THE HOME: Home free of loose throw rugs in walkways, pet beds, electrical cords, etc? Yes  Adequate lighting in your home to reduce risk of falls? Yes   ASSISTIVE DEVICES UTILIZED TO PREVENT FALLS: Life alert? No  Use of a cane, walker or w/c? No   TIMED UP AND GO: Was the test performed? No .   Cognitive Function:        03/03/2022    1:33 PM  6CIT Screen  What Year? 0 points  What month? 0 points  What time? 0 points  Count back from 20 0 points  Months in reverse 0 points  Repeat phrase 0 points  Total Score 0 points    Immunizations Immunization History  Administered Date(s) Administered   Fluad Quad(high Dose 65+) 01/31/2020,  03/02/2021   Influenza Split 01/21/2011   Influenza Whole 02/10/2007, 01/03/2008, 01/14/2009, 01/15/2010   Influenza, Seasonal, Injecte, Preservative Fre 04/06/2012   Influenza,inj,Quad PF,6+ Mos 01/10/2015, 12/05/2015, 12/29/2017, 12/07/2018   Influenza-Unspecified 01/05/2017    PFIZER(Purple Top)SARS-COV-2 Vaccination 05/20/2019, 06/13/2019   Pneumococcal Conjugate-13 07/04/2014   Pneumococcal Polysaccharide-23 04/01/2011   Td 09/24/2002, 04/30/2011   Zoster, Live 02/03/2013   Flu Vaccine status: Due, Education has been provided regarding the importance of this vaccine. Advised may receive this vaccine at local pharmacy or Health Dept. Aware to provide a copy of the vaccination record if obtained from local pharmacy or Health Dept. Verbalized acceptance and understanding.  Shingrix Completed?: No.    Education has been provided regarding the importance of this vaccine. Patient has been advised to call insurance company to determine out of pocket expense if they have not yet received this vaccine. Advised may also receive vaccine at local pharmacy or Health Dept. Verbalized acceptance and understanding.  Screening Tests Health Maintenance  Topic Date Due   INFLUENZA VACCINE  03/04/2022 (Originally 11/03/2021)   COVID-19 Vaccine (3 - 2023-24 season) 03/19/2022 (Originally 12/04/2021)   Zoster Vaccines- Shingrix (1 of 2) 06/03/2022 (Originally 09/11/1995)   Medicare Annual Wellness (AWV)  03/04/2023   Pneumonia Vaccine 57+ Years old  Completed   Hepatitis C Screening  Completed   HPV VACCINES  Aged Out   Fecal DNA (Cologuard)  Discontinued   Health Maintenance There are no preventive care reminders to display for this patient.  Lung Cancer Screening: (Low Dose CT Chest recommended if Age 40-80 years, 30 pack-year currently smoking OR have quit w/in 15years.) does not qualify.   Hepatitis C Screening: Completed 2018.  Vision Screening: Recommended annual ophthalmology exams for early detection of glaucoma and other disorders of the eye.  Dental Screening: Recommended annual dental exams for proper oral hygiene. Visits every 6 months.   Community Resource Referral / Chronic Care Management: CRR required this visit?  No   CCM required this visit?  No      Plan:      I have personally reviewed and noted the following in the patient's chart:   Medical and social history Use of alcohol, tobacco or illicit drugs  Current medications and supplements including opioid prescriptions. Patient is not currently taking opioid prescriptions. Functional ability and status Nutritional status Physical activity Advanced directives List of other physicians Hospitalizations, surgeries, and ER visits in previous 12 months Vitals Screenings to include cognitive, depression, and falls Referrals and appointments  In addition, I have reviewed and discussed with patient certain preventive protocols, quality metrics, and best practice recommendations. A written personalized care plan for preventive services as well as general preventive health recommendations were provided to patient.     Cathey Endow, LPN   [id number]

## 2022-03-04 ENCOUNTER — Other Ambulatory Visit: Payer: Self-pay | Admitting: Family Medicine

## 2022-03-04 ENCOUNTER — Encounter: Payer: Self-pay | Admitting: Family Medicine

## 2022-03-04 ENCOUNTER — Ambulatory Visit (INDEPENDENT_AMBULATORY_CARE_PROVIDER_SITE_OTHER): Payer: Medicare Other | Admitting: Family Medicine

## 2022-03-04 VITALS — BP 124/78 | HR 75 | Temp 97.2°F | Ht 71.0 in | Wt 222.0 lb

## 2022-03-04 DIAGNOSIS — Z23 Encounter for immunization: Secondary | ICD-10-CM

## 2022-03-04 DIAGNOSIS — Z7189 Other specified counseling: Secondary | ICD-10-CM

## 2022-03-04 DIAGNOSIS — M109 Gout, unspecified: Secondary | ICD-10-CM | POA: Diagnosis not present

## 2022-03-04 DIAGNOSIS — N529 Male erectile dysfunction, unspecified: Secondary | ICD-10-CM

## 2022-03-04 DIAGNOSIS — Z125 Encounter for screening for malignant neoplasm of prostate: Secondary | ICD-10-CM

## 2022-03-04 DIAGNOSIS — L309 Dermatitis, unspecified: Secondary | ICD-10-CM

## 2022-03-04 DIAGNOSIS — I10 Essential (primary) hypertension: Secondary | ICD-10-CM | POA: Diagnosis not present

## 2022-03-04 DIAGNOSIS — Z0000 Encounter for general adult medical examination without abnormal findings: Secondary | ICD-10-CM

## 2022-03-04 MED ORDER — COLCHICINE 0.6 MG PO TABS: ORAL_TABLET | ORAL | 2 refills | Status: DC

## 2022-03-04 MED ORDER — SILDENAFIL CITRATE 20 MG PO TABS: 20.0000 mg | ORAL_TABLET | Freq: Every day | ORAL | 12 refills | Status: DC | PRN

## 2022-03-04 MED ORDER — LOSARTAN POTASSIUM 25 MG PO TABS: 25.0000 mg | ORAL_TABLET | Freq: Every day | ORAL | 3 refills | Status: DC

## 2022-03-04 NOTE — Progress Notes (Signed)
Hypertension:    Using medication without problems or lightheadedness: yes Chest pain with exertion:no Edema:occ knee swelling at baseline, likely from prev OA Short of breath:no Labs d/w pt.   Still using dental device for OSA at baseline.  Rare NSAID use.     Gout.  Had used colchicine intermittently, about 3 events. He would feel a change, ie sx early in the process, and take a dose. No recent use.  Not on allopurinol, hasn't started. Still on losartan.  Labs d/w pt.  He is paying attention to his diet.  Uric acid 7.9.  we talked about options.    ED improved with sildenafil, with 100mg  per dose.    Using prn TAC cream for eczema.    Tetanus 2013 Flu 2023 PNA prev done.  Shingles d/w pt.   Covid vaccine prev done.  PSA to be done with next set of labs.   Cologuard 2022 Advance directive-wife designated if patient were incapacitated.   Meds, vitals, and allergies reviewed.   ROS: Per HPI unless specifically indicated in ROS section   GEN: nad, alert and oriented HEENT: ncat NECK: supple w/o LA CV: rrr. PULM: ctab, no inc wob ABD: soft, +bs EXT: no edema SKIN: mild eczema changes on the trunk  30 minutes were devoted to patient care in this encounter (this includes time spent reviewing the patient's file/history, interviewing and examining the patient, counseling/reviewing plan with patient).

## 2022-03-04 NOTE — Patient Instructions (Addendum)
As along as you aren't having flares frequently, then only use colchicine as needed and don't start allopurinol.  Update me as needed.   Recheck fasting labs in about 3 months.   Take care.  Glad to see you.

## 2022-03-07 DIAGNOSIS — Z0000 Encounter for general adult medical examination without abnormal findings: Secondary | ICD-10-CM | POA: Insufficient documentation

## 2022-03-07 NOTE — Assessment & Plan Note (Signed)
Continue sildenafil as needed. 

## 2022-03-07 NOTE — Assessment & Plan Note (Signed)
Continue losartan.  Continue work on diet and exercise. 

## 2022-03-07 NOTE — Assessment & Plan Note (Signed)
Using colchicine only intermittently.  Not on allopurinol.  Discussed options.  Discussed gout diet.  We talked about rechecking his labs in a few months.  If he still doing well at that point then he can just continue with colchicine as needed.  If he is having more flares in the meantime then he can update me.  See orders.

## 2022-03-07 NOTE — Assessment & Plan Note (Signed)
Continue triamcinolone as needed.

## 2022-03-07 NOTE — Assessment & Plan Note (Signed)
Tetanus 2013 Flu 2023 PNA prev done.  Shingles d/w pt.   Covid vaccine prev done.  PSA to be done with next set of labs.   Cologuard 2022 Advance directive-wife designated if patient were incapacitated.

## 2022-03-07 NOTE — Assessment & Plan Note (Signed)
Advance directive- wife designated if patient were incapacitated.  

## 2022-06-03 ENCOUNTER — Other Ambulatory Visit (INDEPENDENT_AMBULATORY_CARE_PROVIDER_SITE_OTHER): Payer: Medicare Other

## 2022-06-03 DIAGNOSIS — Z125 Encounter for screening for malignant neoplasm of prostate: Secondary | ICD-10-CM

## 2022-06-03 DIAGNOSIS — I10 Essential (primary) hypertension: Secondary | ICD-10-CM | POA: Diagnosis not present

## 2022-06-03 DIAGNOSIS — M109 Gout, unspecified: Secondary | ICD-10-CM

## 2022-06-03 LAB — COMPREHENSIVE METABOLIC PANEL
ALT: 14 U/L (ref 0–53)
AST: 20 U/L (ref 0–37)
Albumin: 3.8 g/dL (ref 3.5–5.2)
Alkaline Phosphatase: 47 U/L (ref 39–117)
BUN: 17 mg/dL (ref 6–23)
CO2: 31 mEq/L (ref 19–32)
Calcium: 9.6 mg/dL (ref 8.4–10.5)
Chloride: 103 mEq/L (ref 96–112)
Creatinine, Ser: 1.21 mg/dL (ref 0.40–1.50)
GFR: 58.07 mL/min — ABNORMAL LOW (ref >60.00)
Glucose, Bld: 92 mg/dL (ref 70–99)
Potassium: 4.5 mEq/L (ref 3.5–5.1)
Sodium: 140 mEq/L (ref 135–145)
Total Bilirubin: 0.7 mg/dL (ref 0.2–1.2)
Total Protein: 6.2 g/dL (ref 6.0–8.3)

## 2022-06-03 LAB — LIPID PANEL
Cholesterol: 167 mg/dL (ref 0–200)
HDL: 42.5 mg/dL (ref >39.00)
LDL Cholesterol: 96 mg/dL (ref 0–99)
NonHDL: 124.97
Total CHOL/HDL Ratio: 4
Triglycerides: 143 mg/dL (ref 0.0–149.0)
VLDL: 28.6 mg/dL (ref 0.0–40.0)

## 2022-06-03 LAB — CBC WITH DIFFERENTIAL/PLATELET
Basophils Absolute: 0.1 10*3/uL (ref 0.0–0.1)
Basophils Relative: 1 % (ref 0.0–3.0)
Eosinophils Absolute: 0.3 10*3/uL (ref 0.0–0.7)
Eosinophils Relative: 5.3 % — ABNORMAL HIGH (ref 0.0–5.0)
HCT: 46.2 % (ref 39.0–52.0)
Hemoglobin: 15.8 g/dL (ref 13.0–17.0)
Lymphocytes Relative: 32.6 % (ref 12.0–46.0)
Lymphs Abs: 2.1 10*3/uL (ref 0.7–4.0)
MCHC: 34.2 g/dL (ref 30.0–36.0)
MCV: 90.8 fl (ref 78.0–100.0)
Monocytes Absolute: 0.8 10*3/uL (ref 0.1–1.0)
Monocytes Relative: 12.4 % — ABNORMAL HIGH (ref 3.0–12.0)
Neutro Abs: 3.2 10*3/uL (ref 1.4–7.7)
Neutrophils Relative %: 48.7 % (ref 43.0–77.0)
Platelets: 243 10*3/uL (ref 150.0–400.0)
RBC: 5.08 Mil/uL (ref 4.22–5.81)
RDW: 12.6 % (ref 11.5–15.5)
WBC: 6.6 10*3/uL (ref 4.0–10.5)

## 2022-06-03 LAB — URIC ACID: Uric Acid, Serum: 8.2 mg/dL — ABNORMAL HIGH (ref 4.0–7.8)

## 2022-06-03 LAB — PSA, MEDICARE: PSA: 0.57 ng/ml (ref 0.10–4.00)

## 2022-06-03 LAB — TSH: TSH: 2.55 u[IU]/mL (ref 0.35–5.50)

## 2022-06-05 ENCOUNTER — Encounter: Payer: Self-pay | Admitting: Family Medicine

## 2022-06-07 DIAGNOSIS — D225 Melanocytic nevi of trunk: Secondary | ICD-10-CM | POA: Diagnosis not present

## 2022-06-07 DIAGNOSIS — D2262 Melanocytic nevi of left upper limb, including shoulder: Secondary | ICD-10-CM | POA: Diagnosis not present

## 2022-06-07 DIAGNOSIS — D2272 Melanocytic nevi of left lower limb, including hip: Secondary | ICD-10-CM | POA: Diagnosis not present

## 2022-06-07 DIAGNOSIS — D2261 Melanocytic nevi of right upper limb, including shoulder: Secondary | ICD-10-CM | POA: Diagnosis not present

## 2022-06-09 ENCOUNTER — Other Ambulatory Visit: Payer: Self-pay | Admitting: Family Medicine

## 2022-06-09 DIAGNOSIS — M109 Gout, unspecified: Secondary | ICD-10-CM

## 2022-06-09 MED ORDER — ALLOPURINOL 100 MG PO TABS: 100.0000 mg | ORAL_TABLET | Freq: Every day | ORAL | Status: DC

## 2022-06-23 DIAGNOSIS — R059 Cough, unspecified: Secondary | ICD-10-CM | POA: Diagnosis not present

## 2022-06-23 DIAGNOSIS — R0989 Other specified symptoms and signs involving the circulatory and respiratory systems: Secondary | ICD-10-CM | POA: Diagnosis not present

## 2022-06-23 DIAGNOSIS — R051 Acute cough: Secondary | ICD-10-CM | POA: Diagnosis not present

## 2022-06-23 DIAGNOSIS — M47814 Spondylosis without myelopathy or radiculopathy, thoracic region: Secondary | ICD-10-CM | POA: Diagnosis not present

## 2022-06-23 DIAGNOSIS — J40 Bronchitis, not specified as acute or chronic: Secondary | ICD-10-CM | POA: Diagnosis not present

## 2022-06-23 DIAGNOSIS — M546 Pain in thoracic spine: Secondary | ICD-10-CM | POA: Diagnosis not present

## 2022-06-23 DIAGNOSIS — Z03818 Encounter for observation for suspected exposure to other biological agents ruled out: Secondary | ICD-10-CM | POA: Diagnosis not present

## 2022-07-14 ENCOUNTER — Other Ambulatory Visit (INDEPENDENT_AMBULATORY_CARE_PROVIDER_SITE_OTHER): Payer: Medicare Other

## 2022-07-14 DIAGNOSIS — M109 Gout, unspecified: Secondary | ICD-10-CM

## 2022-07-14 LAB — BASIC METABOLIC PANEL
BUN: 24 mg/dL — ABNORMAL HIGH (ref 6–23)
CO2: 28 mEq/L (ref 19–32)
Calcium: 9.4 mg/dL (ref 8.4–10.5)
Chloride: 103 mEq/L (ref 96–112)
Creatinine, Ser: 1.14 mg/dL (ref 0.40–1.50)
GFR: 62.33 mL/min (ref >60.00)
Glucose, Bld: 84 mg/dL (ref 70–99)
Potassium: 4.1 mEq/L (ref 3.5–5.1)
Sodium: 139 mEq/L (ref 135–145)

## 2022-07-14 LAB — URIC ACID: Uric Acid, Serum: 7.1 mg/dL (ref 4.0–7.8)

## 2022-07-18 NOTE — Progress Notes (Unsigned)
    Alan Juncaj T. Kee Drudge, MD, CAQ Sports Medicine [facility] at [facility] [address] [place], [address]  Phone: [phone]  FAX: [phone]  Alan Castillo - 77 y.o. male  MRN [id number]  Date of Birth: September 28, 1945  Date: 07/19/2022  PCP: Joaquim Nam, MD  Referral: Joaquim Nam, MD  No chief complaint on file.  Subjective:   Alan Castillo is a 77 y.o. very pleasant male patient with There is no height or weight on file to calculate BMI. who presents with the following:  The patient presents with ongoing right-sided shoulder pain.  Historically, he has also had issues with gout.    Review of Systems is noted in the HPI, as appropriate  Objective:   There were no vitals taken for this visit.  GEN: No acute distress; alert,appropriate. PULM: Breathing comfortably in no respiratory distress PSYCH: Normally interactive.   Laboratory and Imaging Data:  Assessment and Plan:   ***

## 2022-07-19 ENCOUNTER — Ambulatory Visit (INDEPENDENT_AMBULATORY_CARE_PROVIDER_SITE_OTHER): Payer: Medicare Other | Admitting: Family Medicine

## 2022-07-19 ENCOUNTER — Encounter: Payer: Self-pay | Admitting: Family Medicine

## 2022-07-19 ENCOUNTER — Encounter: Payer: Self-pay | Admitting: *Deleted

## 2022-07-19 VITALS — BP 110/80 | HR 73 | Temp 98.3°F | Ht 71.0 in | Wt 220.2 lb

## 2022-07-19 DIAGNOSIS — M5412 Radiculopathy, cervical region: Secondary | ICD-10-CM

## 2022-07-19 DIAGNOSIS — M25511 Pain in right shoulder: Secondary | ICD-10-CM | POA: Diagnosis not present

## 2022-07-19 MED ORDER — PREDNISONE 20 MG PO TABS
ORAL_TABLET | ORAL | 0 refills | Status: DC
Start: 2022-07-19 — End: 2022-08-16

## 2022-07-26 DIAGNOSIS — M5412 Radiculopathy, cervical region: Secondary | ICD-10-CM | POA: Diagnosis not present

## 2022-08-02 DIAGNOSIS — M5412 Radiculopathy, cervical region: Secondary | ICD-10-CM | POA: Diagnosis not present

## 2022-08-09 DIAGNOSIS — M5412 Radiculopathy, cervical region: Secondary | ICD-10-CM | POA: Diagnosis not present

## 2022-08-15 NOTE — Progress Notes (Unsigned)
    Alan Buckles T. Garek Schuneman, MD, CAQ Sports Medicine [facility] at [facility] [address] [place], [address]  Phone: [phone]  FAX: [phone]  Alan Castillo - 77 y.o. male  MRN [id number]  Date of Birth: [birth year]  Date: 08/16/2022  PCP: Joaquim Nam, MD  Referral: Joaquim Nam, MD  No chief complaint on file.  Subjective:   Alan Castillo is a 77 y.o. very pleasant male patient with There is no height or weight on file to calculate BMI. who presents with the following:  Patient presents with ongoing hip pain.  I recently saw him last month for some cervical radiculopathy.    Review of Systems is noted in the HPI, as appropriate  Objective:   There were no vitals taken for this visit.  GEN: No acute distress; alert,appropriate. PULM: Breathing comfortably in no respiratory distress PSYCH: Normally interactive.   Laboratory and Imaging Data:  Assessment and Plan:   ***

## 2022-08-16 ENCOUNTER — Ambulatory Visit (INDEPENDENT_AMBULATORY_CARE_PROVIDER_SITE_OTHER)
Admission: RE | Admit: 2022-08-16 | Discharge: 2022-08-16 | Disposition: A | Discharge status: Home / Self Care | Payer: Medicare Other | Source: Ambulatory Visit | Attending: Family Medicine | Admitting: Family Medicine

## 2022-08-16 ENCOUNTER — Ambulatory Visit (INDEPENDENT_AMBULATORY_CARE_PROVIDER_SITE_OTHER): Payer: Medicare Other | Admitting: Family Medicine

## 2022-08-16 ENCOUNTER — Encounter: Payer: Self-pay | Admitting: Family Medicine

## 2022-08-16 ENCOUNTER — Encounter: Payer: Self-pay | Admitting: *Deleted

## 2022-08-16 VITALS — BP 140/78 | HR 66 | Temp 97.7°F | Ht 71.0 in | Wt 217.2 lb

## 2022-08-16 DIAGNOSIS — M50321 Other cervical disc degeneration at C4-C5 level: Secondary | ICD-10-CM | POA: Diagnosis not present

## 2022-08-16 DIAGNOSIS — M5412 Radiculopathy, cervical region: Secondary | ICD-10-CM | POA: Diagnosis not present

## 2022-08-16 DIAGNOSIS — M545 Low back pain, unspecified: Secondary | ICD-10-CM

## 2022-08-16 MED ORDER — PREDNISONE 20 MG PO TABS: ORAL_TABLET | ORAL | 0 refills | Status: DC

## 2022-08-23 DIAGNOSIS — M5412 Radiculopathy, cervical region: Secondary | ICD-10-CM | POA: Diagnosis not present

## 2022-09-01 ENCOUNTER — Ambulatory Visit: Payer: Medicare Other | Admitting: Family Medicine

## 2022-09-01 DIAGNOSIS — M5412 Radiculopathy, cervical region: Secondary | ICD-10-CM | POA: Diagnosis not present

## 2022-09-01 NOTE — Telephone Encounter (Signed)
French Ana from [facility] called in stated they did not receive the referral . Would like it to be sent again . Please advise # [phone]

## 2022-09-02 ENCOUNTER — Other Ambulatory Visit: Payer: Self-pay | Admitting: Family Medicine

## 2022-09-02 DIAGNOSIS — M109 Gout, unspecified: Secondary | ICD-10-CM

## 2022-09-08 DIAGNOSIS — M5412 Radiculopathy, cervical region: Secondary | ICD-10-CM | POA: Diagnosis not present

## 2022-09-15 DIAGNOSIS — M5412 Radiculopathy, cervical region: Secondary | ICD-10-CM | POA: Diagnosis not present

## 2022-09-20 DIAGNOSIS — M5412 Radiculopathy, cervical region: Secondary | ICD-10-CM | POA: Diagnosis not present

## 2022-09-27 ENCOUNTER — Ambulatory Visit: Payer: Medicare Other | Admitting: Family Medicine

## 2022-09-30 ENCOUNTER — Ambulatory Visit: Payer: Medicare Other | Admitting: Family Medicine

## 2022-10-01 DIAGNOSIS — M25551 Pain in right hip: Secondary | ICD-10-CM | POA: Insufficient documentation

## 2022-10-04 ENCOUNTER — Telehealth: Payer: Self-pay | Admitting: Family Medicine

## 2022-10-04 DIAGNOSIS — M47816 Spondylosis without myelopathy or radiculopathy, lumbar region: Secondary | ICD-10-CM | POA: Insufficient documentation

## 2022-10-04 DIAGNOSIS — M25551 Pain in right hip: Secondary | ICD-10-CM | POA: Diagnosis not present

## 2022-10-04 DIAGNOSIS — M5136 Other intervertebral disc degeneration, lumbar region: Secondary | ICD-10-CM | POA: Diagnosis not present

## 2022-10-04 DIAGNOSIS — M51369 Other intervertebral disc degeneration, lumbar region without mention of lumbar back pain or lower extremity pain: Secondary | ICD-10-CM | POA: Insufficient documentation

## 2022-10-04 DIAGNOSIS — G8929 Other chronic pain: Secondary | ICD-10-CM | POA: Insufficient documentation

## 2022-10-04 NOTE — Telephone Encounter (Signed)
Patient walked into the office this morning and was advised to call the radiology department. We do not have disk here to do this.

## 2022-10-04 NOTE — Telephone Encounter (Signed)
Pt called asking if there was any possible way he could get his recent xrays put on a disk? Pt stated he has an appt with his orthopedist today, 7/1 @ 10am. Pt requested to pick up disk before his appt. Call back # [phone]

## 2022-10-11 DIAGNOSIS — M545 Low back pain, unspecified: Secondary | ICD-10-CM | POA: Diagnosis not present

## 2022-10-18 DIAGNOSIS — M5136 Other intervertebral disc degeneration, lumbar region: Secondary | ICD-10-CM | POA: Diagnosis not present

## 2022-10-18 DIAGNOSIS — M5416 Radiculopathy, lumbar region: Secondary | ICD-10-CM | POA: Diagnosis not present

## 2022-10-18 DIAGNOSIS — M47816 Spondylosis without myelopathy or radiculopathy, lumbar region: Secondary | ICD-10-CM | POA: Diagnosis not present

## 2022-10-25 DIAGNOSIS — M545 Low back pain, unspecified: Secondary | ICD-10-CM | POA: Diagnosis not present

## 2022-10-25 DIAGNOSIS — M47816 Spondylosis without myelopathy or radiculopathy, lumbar region: Secondary | ICD-10-CM | POA: Insufficient documentation

## 2022-10-25 DIAGNOSIS — M48061 Spinal stenosis, lumbar region without neurogenic claudication: Secondary | ICD-10-CM | POA: Insufficient documentation

## 2022-10-25 DIAGNOSIS — M47896 Other spondylosis, lumbar region: Secondary | ICD-10-CM | POA: Diagnosis not present

## 2022-10-25 DIAGNOSIS — M48062 Spinal stenosis, lumbar region with neurogenic claudication: Secondary | ICD-10-CM | POA: Diagnosis not present

## 2022-12-02 DIAGNOSIS — H60331 Swimmer's ear, right ear: Secondary | ICD-10-CM | POA: Diagnosis not present

## 2022-12-20 DIAGNOSIS — H60331 Swimmer's ear, right ear: Secondary | ICD-10-CM | POA: Diagnosis not present

## 2022-12-30 DIAGNOSIS — M9901 Segmental and somatic dysfunction of cervical region: Secondary | ICD-10-CM | POA: Diagnosis not present

## 2022-12-30 DIAGNOSIS — M9903 Segmental and somatic dysfunction of lumbar region: Secondary | ICD-10-CM | POA: Diagnosis not present

## 2022-12-30 DIAGNOSIS — M545 Low back pain, unspecified: Secondary | ICD-10-CM | POA: Diagnosis not present

## 2022-12-30 DIAGNOSIS — M9904 Segmental and somatic dysfunction of sacral region: Secondary | ICD-10-CM | POA: Diagnosis not present

## 2023-01-03 DIAGNOSIS — M9904 Segmental and somatic dysfunction of sacral region: Secondary | ICD-10-CM | POA: Diagnosis not present

## 2023-01-03 DIAGNOSIS — M545 Low back pain, unspecified: Secondary | ICD-10-CM | POA: Diagnosis not present

## 2023-01-03 DIAGNOSIS — M9903 Segmental and somatic dysfunction of lumbar region: Secondary | ICD-10-CM | POA: Diagnosis not present

## 2023-01-03 DIAGNOSIS — M9901 Segmental and somatic dysfunction of cervical region: Secondary | ICD-10-CM | POA: Diagnosis not present

## 2023-01-05 DIAGNOSIS — M9904 Segmental and somatic dysfunction of sacral region: Secondary | ICD-10-CM | POA: Diagnosis not present

## 2023-01-05 DIAGNOSIS — M545 Low back pain, unspecified: Secondary | ICD-10-CM | POA: Diagnosis not present

## 2023-01-05 DIAGNOSIS — M9903 Segmental and somatic dysfunction of lumbar region: Secondary | ICD-10-CM | POA: Diagnosis not present

## 2023-01-05 DIAGNOSIS — M9901 Segmental and somatic dysfunction of cervical region: Secondary | ICD-10-CM | POA: Diagnosis not present

## 2023-01-06 DIAGNOSIS — M545 Low back pain, unspecified: Secondary | ICD-10-CM | POA: Diagnosis not present

## 2023-01-06 DIAGNOSIS — Z961 Presence of intraocular lens: Secondary | ICD-10-CM | POA: Diagnosis not present

## 2023-01-06 DIAGNOSIS — H43811 Vitreous degeneration, right eye: Secondary | ICD-10-CM | POA: Diagnosis not present

## 2023-01-06 DIAGNOSIS — H35371 Puckering of macula, right eye: Secondary | ICD-10-CM | POA: Diagnosis not present

## 2023-01-06 DIAGNOSIS — M9904 Segmental and somatic dysfunction of sacral region: Secondary | ICD-10-CM | POA: Diagnosis not present

## 2023-01-06 DIAGNOSIS — M9903 Segmental and somatic dysfunction of lumbar region: Secondary | ICD-10-CM | POA: Diagnosis not present

## 2023-01-06 DIAGNOSIS — H04123 Dry eye syndrome of bilateral lacrimal glands: Secondary | ICD-10-CM | POA: Diagnosis not present

## 2023-01-06 DIAGNOSIS — M9901 Segmental and somatic dysfunction of cervical region: Secondary | ICD-10-CM | POA: Diagnosis not present

## 2023-01-10 DIAGNOSIS — M545 Low back pain, unspecified: Secondary | ICD-10-CM | POA: Diagnosis not present

## 2023-01-10 DIAGNOSIS — M9901 Segmental and somatic dysfunction of cervical region: Secondary | ICD-10-CM | POA: Diagnosis not present

## 2023-01-10 DIAGNOSIS — M9904 Segmental and somatic dysfunction of sacral region: Secondary | ICD-10-CM | POA: Diagnosis not present

## 2023-01-10 DIAGNOSIS — M9903 Segmental and somatic dysfunction of lumbar region: Secondary | ICD-10-CM | POA: Diagnosis not present

## 2023-01-11 DIAGNOSIS — M9904 Segmental and somatic dysfunction of sacral region: Secondary | ICD-10-CM | POA: Diagnosis not present

## 2023-01-11 DIAGNOSIS — M545 Low back pain, unspecified: Secondary | ICD-10-CM | POA: Diagnosis not present

## 2023-01-11 DIAGNOSIS — M9903 Segmental and somatic dysfunction of lumbar region: Secondary | ICD-10-CM | POA: Diagnosis not present

## 2023-01-11 DIAGNOSIS — M9901 Segmental and somatic dysfunction of cervical region: Secondary | ICD-10-CM | POA: Diagnosis not present

## 2023-01-12 DIAGNOSIS — M9903 Segmental and somatic dysfunction of lumbar region: Secondary | ICD-10-CM | POA: Diagnosis not present

## 2023-01-12 DIAGNOSIS — M9901 Segmental and somatic dysfunction of cervical region: Secondary | ICD-10-CM | POA: Diagnosis not present

## 2023-01-12 DIAGNOSIS — M9904 Segmental and somatic dysfunction of sacral region: Secondary | ICD-10-CM | POA: Diagnosis not present

## 2023-01-12 DIAGNOSIS — M545 Low back pain, unspecified: Secondary | ICD-10-CM | POA: Diagnosis not present

## 2023-01-17 DIAGNOSIS — M9904 Segmental and somatic dysfunction of sacral region: Secondary | ICD-10-CM | POA: Diagnosis not present

## 2023-01-17 DIAGNOSIS — M545 Low back pain, unspecified: Secondary | ICD-10-CM | POA: Diagnosis not present

## 2023-01-17 DIAGNOSIS — M9903 Segmental and somatic dysfunction of lumbar region: Secondary | ICD-10-CM | POA: Diagnosis not present

## 2023-01-17 DIAGNOSIS — M9901 Segmental and somatic dysfunction of cervical region: Secondary | ICD-10-CM | POA: Diagnosis not present

## 2023-01-19 DIAGNOSIS — M545 Low back pain, unspecified: Secondary | ICD-10-CM | POA: Diagnosis not present

## 2023-01-19 DIAGNOSIS — M9903 Segmental and somatic dysfunction of lumbar region: Secondary | ICD-10-CM | POA: Diagnosis not present

## 2023-01-19 DIAGNOSIS — M9904 Segmental and somatic dysfunction of sacral region: Secondary | ICD-10-CM | POA: Diagnosis not present

## 2023-01-19 DIAGNOSIS — M9901 Segmental and somatic dysfunction of cervical region: Secondary | ICD-10-CM | POA: Diagnosis not present

## 2023-01-20 DIAGNOSIS — M9904 Segmental and somatic dysfunction of sacral region: Secondary | ICD-10-CM | POA: Diagnosis not present

## 2023-01-20 DIAGNOSIS — M545 Low back pain, unspecified: Secondary | ICD-10-CM | POA: Diagnosis not present

## 2023-01-20 DIAGNOSIS — M9903 Segmental and somatic dysfunction of lumbar region: Secondary | ICD-10-CM | POA: Diagnosis not present

## 2023-01-20 DIAGNOSIS — M9901 Segmental and somatic dysfunction of cervical region: Secondary | ICD-10-CM | POA: Diagnosis not present

## 2023-01-24 DIAGNOSIS — M545 Low back pain, unspecified: Secondary | ICD-10-CM | POA: Diagnosis not present

## 2023-01-24 DIAGNOSIS — M9903 Segmental and somatic dysfunction of lumbar region: Secondary | ICD-10-CM | POA: Diagnosis not present

## 2023-01-24 DIAGNOSIS — M9901 Segmental and somatic dysfunction of cervical region: Secondary | ICD-10-CM | POA: Diagnosis not present

## 2023-01-24 DIAGNOSIS — M9904 Segmental and somatic dysfunction of sacral region: Secondary | ICD-10-CM | POA: Diagnosis not present

## 2023-01-27 DIAGNOSIS — M545 Low back pain, unspecified: Secondary | ICD-10-CM | POA: Diagnosis not present

## 2023-01-27 DIAGNOSIS — M9901 Segmental and somatic dysfunction of cervical region: Secondary | ICD-10-CM | POA: Diagnosis not present

## 2023-01-27 DIAGNOSIS — M9904 Segmental and somatic dysfunction of sacral region: Secondary | ICD-10-CM | POA: Diagnosis not present

## 2023-01-27 DIAGNOSIS — M9903 Segmental and somatic dysfunction of lumbar region: Secondary | ICD-10-CM | POA: Diagnosis not present

## 2023-01-31 DIAGNOSIS — M545 Low back pain, unspecified: Secondary | ICD-10-CM | POA: Diagnosis not present

## 2023-01-31 DIAGNOSIS — M9904 Segmental and somatic dysfunction of sacral region: Secondary | ICD-10-CM | POA: Diagnosis not present

## 2023-01-31 DIAGNOSIS — M9901 Segmental and somatic dysfunction of cervical region: Secondary | ICD-10-CM | POA: Diagnosis not present

## 2023-01-31 DIAGNOSIS — M9903 Segmental and somatic dysfunction of lumbar region: Secondary | ICD-10-CM | POA: Diagnosis not present

## 2023-02-03 DIAGNOSIS — M545 Low back pain, unspecified: Secondary | ICD-10-CM | POA: Diagnosis not present

## 2023-02-03 DIAGNOSIS — M9903 Segmental and somatic dysfunction of lumbar region: Secondary | ICD-10-CM | POA: Diagnosis not present

## 2023-02-03 DIAGNOSIS — M9904 Segmental and somatic dysfunction of sacral region: Secondary | ICD-10-CM | POA: Diagnosis not present

## 2023-02-03 DIAGNOSIS — M9901 Segmental and somatic dysfunction of cervical region: Secondary | ICD-10-CM | POA: Diagnosis not present

## 2023-02-07 DIAGNOSIS — M545 Low back pain, unspecified: Secondary | ICD-10-CM | POA: Diagnosis not present

## 2023-02-07 DIAGNOSIS — M9901 Segmental and somatic dysfunction of cervical region: Secondary | ICD-10-CM | POA: Diagnosis not present

## 2023-02-07 DIAGNOSIS — M9904 Segmental and somatic dysfunction of sacral region: Secondary | ICD-10-CM | POA: Diagnosis not present

## 2023-02-07 DIAGNOSIS — M9903 Segmental and somatic dysfunction of lumbar region: Secondary | ICD-10-CM | POA: Diagnosis not present

## 2023-02-09 ENCOUNTER — Telehealth: Payer: Self-pay | Admitting: Family Medicine

## 2023-02-09 NOTE — Telephone Encounter (Signed)
Pt called in and stated that he not doing the annual wellness on the phone with anybody but Para March pt would like a phone called if possible from Para March pt can be reached at [phone]

## 2023-02-10 DIAGNOSIS — M9901 Segmental and somatic dysfunction of cervical region: Secondary | ICD-10-CM | POA: Diagnosis not present

## 2023-02-10 DIAGNOSIS — M9904 Segmental and somatic dysfunction of sacral region: Secondary | ICD-10-CM | POA: Diagnosis not present

## 2023-02-10 DIAGNOSIS — M9903 Segmental and somatic dysfunction of lumbar region: Secondary | ICD-10-CM | POA: Diagnosis not present

## 2023-02-10 DIAGNOSIS — M545 Low back pain, unspecified: Secondary | ICD-10-CM | POA: Diagnosis not present

## 2023-02-14 DIAGNOSIS — M9904 Segmental and somatic dysfunction of sacral region: Secondary | ICD-10-CM | POA: Diagnosis not present

## 2023-02-14 DIAGNOSIS — M9901 Segmental and somatic dysfunction of cervical region: Secondary | ICD-10-CM | POA: Diagnosis not present

## 2023-02-14 DIAGNOSIS — M545 Low back pain, unspecified: Secondary | ICD-10-CM | POA: Diagnosis not present

## 2023-02-14 DIAGNOSIS — M9903 Segmental and somatic dysfunction of lumbar region: Secondary | ICD-10-CM | POA: Diagnosis not present

## 2023-02-17 DIAGNOSIS — M9901 Segmental and somatic dysfunction of cervical region: Secondary | ICD-10-CM | POA: Diagnosis not present

## 2023-02-17 DIAGNOSIS — M545 Low back pain, unspecified: Secondary | ICD-10-CM | POA: Diagnosis not present

## 2023-02-17 DIAGNOSIS — M9903 Segmental and somatic dysfunction of lumbar region: Secondary | ICD-10-CM | POA: Diagnosis not present

## 2023-02-17 DIAGNOSIS — M9904 Segmental and somatic dysfunction of sacral region: Secondary | ICD-10-CM | POA: Diagnosis not present

## 2023-02-21 DIAGNOSIS — M545 Low back pain, unspecified: Secondary | ICD-10-CM | POA: Diagnosis not present

## 2023-02-21 DIAGNOSIS — M9904 Segmental and somatic dysfunction of sacral region: Secondary | ICD-10-CM | POA: Diagnosis not present

## 2023-02-21 DIAGNOSIS — M9901 Segmental and somatic dysfunction of cervical region: Secondary | ICD-10-CM | POA: Diagnosis not present

## 2023-02-21 DIAGNOSIS — M9903 Segmental and somatic dysfunction of lumbar region: Secondary | ICD-10-CM | POA: Diagnosis not present

## 2023-02-24 DIAGNOSIS — M9901 Segmental and somatic dysfunction of cervical region: Secondary | ICD-10-CM | POA: Diagnosis not present

## 2023-02-24 DIAGNOSIS — M545 Low back pain, unspecified: Secondary | ICD-10-CM | POA: Diagnosis not present

## 2023-02-24 DIAGNOSIS — M9904 Segmental and somatic dysfunction of sacral region: Secondary | ICD-10-CM | POA: Diagnosis not present

## 2023-02-24 DIAGNOSIS — M9903 Segmental and somatic dysfunction of lumbar region: Secondary | ICD-10-CM | POA: Diagnosis not present

## 2023-02-25 ENCOUNTER — Other Ambulatory Visit: Payer: Self-pay | Admitting: Family Medicine

## 2023-02-25 DIAGNOSIS — M545 Low back pain, unspecified: Secondary | ICD-10-CM | POA: Diagnosis not present

## 2023-02-25 DIAGNOSIS — M9901 Segmental and somatic dysfunction of cervical region: Secondary | ICD-10-CM | POA: Diagnosis not present

## 2023-02-25 DIAGNOSIS — M9903 Segmental and somatic dysfunction of lumbar region: Secondary | ICD-10-CM | POA: Diagnosis not present

## 2023-02-25 DIAGNOSIS — I10 Essential (primary) hypertension: Secondary | ICD-10-CM

## 2023-02-25 DIAGNOSIS — M9904 Segmental and somatic dysfunction of sacral region: Secondary | ICD-10-CM | POA: Diagnosis not present

## 2023-02-28 ENCOUNTER — Other Ambulatory Visit: Payer: Medicare Other

## 2023-02-28 ENCOUNTER — Other Ambulatory Visit: Payer: Self-pay | Admitting: Family Medicine

## 2023-02-28 DIAGNOSIS — Z125 Encounter for screening for malignant neoplasm of prostate: Secondary | ICD-10-CM

## 2023-02-28 DIAGNOSIS — M109 Gout, unspecified: Secondary | ICD-10-CM

## 2023-02-28 DIAGNOSIS — I10 Essential (primary) hypertension: Secondary | ICD-10-CM | POA: Diagnosis not present

## 2023-02-28 DIAGNOSIS — M9903 Segmental and somatic dysfunction of lumbar region: Secondary | ICD-10-CM | POA: Diagnosis not present

## 2023-02-28 DIAGNOSIS — M545 Low back pain, unspecified: Secondary | ICD-10-CM | POA: Diagnosis not present

## 2023-02-28 DIAGNOSIS — M9904 Segmental and somatic dysfunction of sacral region: Secondary | ICD-10-CM | POA: Diagnosis not present

## 2023-02-28 DIAGNOSIS — M9901 Segmental and somatic dysfunction of cervical region: Secondary | ICD-10-CM | POA: Diagnosis not present

## 2023-02-28 LAB — LIPID PANEL
Cholesterol: 185 mg/dL (ref 0–200)
HDL: 41.5 mg/dL (ref >39.00)
LDL Cholesterol: 116 mg/dL — ABNORMAL HIGH (ref 0–99)
NonHDL: 143.09
Total CHOL/HDL Ratio: 4
Triglycerides: 133 mg/dL (ref 0.0–149.0)
VLDL: 26.6 mg/dL (ref 0.0–40.0)

## 2023-02-28 LAB — TSH: TSH: 2.11 u[IU]/mL (ref 0.35–5.50)

## 2023-02-28 LAB — COMPREHENSIVE METABOLIC PANEL
ALT: 17 U/L (ref 0–53)
AST: 19 U/L (ref 0–37)
Albumin: 4.1 g/dL (ref 3.5–5.2)
Alkaline Phosphatase: 48 U/L (ref 39–117)
BUN: 22 mg/dL (ref 6–23)
CO2: 29 meq/L (ref 19–32)
Calcium: 9.5 mg/dL (ref 8.4–10.5)
Chloride: 105 meq/L (ref 96–112)
Creatinine, Ser: 1.22 mg/dL (ref 0.40–1.50)
GFR: 57.2 mL/min — ABNORMAL LOW (ref >60.00)
Glucose, Bld: 97 mg/dL (ref 70–99)
Potassium: 4.4 meq/L (ref 3.5–5.1)
Sodium: 140 meq/L (ref 135–145)
Total Bilirubin: 0.7 mg/dL (ref 0.2–1.2)
Total Protein: 6.4 g/dL (ref 6.0–8.3)

## 2023-02-28 LAB — CBC WITH DIFFERENTIAL/PLATELET
Basophils Absolute: 0.1 10*3/uL (ref 0.0–0.1)
Basophils Relative: 0.9 % (ref 0.0–3.0)
Eosinophils Absolute: 0.4 10*3/uL (ref 0.0–0.7)
Eosinophils Relative: 4.7 % (ref 0.0–5.0)
HCT: 45.4 % (ref 39.0–52.0)
Hemoglobin: 15.1 g/dL (ref 13.0–17.0)
Lymphocytes Relative: 28.6 % (ref 12.0–46.0)
Lymphs Abs: 2.1 10*3/uL (ref 0.7–4.0)
MCHC: 33.4 g/dL (ref 30.0–36.0)
MCV: 93.5 fL (ref 78.0–100.0)
Monocytes Absolute: 0.9 10*3/uL (ref 0.1–1.0)
Monocytes Relative: 12.3 % — ABNORMAL HIGH (ref 3.0–12.0)
Neutro Abs: 4 10*3/uL (ref 1.4–7.7)
Neutrophils Relative %: 53.5 % (ref 43.0–77.0)
Platelets: 237 10*3/uL (ref 150.0–400.0)
RBC: 4.85 Mil/uL (ref 4.22–5.81)
RDW: 13 % (ref 11.5–15.5)
WBC: 7.4 10*3/uL (ref 4.0–10.5)

## 2023-02-28 LAB — PSA, MEDICARE: PSA: 0.57 ng/mL (ref 0.10–4.00)

## 2023-02-28 LAB — URIC ACID: Uric Acid, Serum: 7.2 mg/dL (ref 4.0–7.8)

## 2023-03-02 DIAGNOSIS — M545 Low back pain, unspecified: Secondary | ICD-10-CM | POA: Diagnosis not present

## 2023-03-02 DIAGNOSIS — M9904 Segmental and somatic dysfunction of sacral region: Secondary | ICD-10-CM | POA: Diagnosis not present

## 2023-03-02 DIAGNOSIS — M9903 Segmental and somatic dysfunction of lumbar region: Secondary | ICD-10-CM | POA: Diagnosis not present

## 2023-03-02 DIAGNOSIS — M9901 Segmental and somatic dysfunction of cervical region: Secondary | ICD-10-CM | POA: Diagnosis not present

## 2023-03-02 NOTE — Telephone Encounter (Signed)
Called patient reviewed all information and repeated back to me. Will call if any questions.  ? ?

## 2023-03-02 NOTE — Telephone Encounter (Signed)
Please call pt.  I am out of clinic today.  We can do his medicare visit with me 03/07/23.  Please let me know if there is something else that we need to address in the meantime.  And please tell him I hope he has a happy thanksgiving.  Thanks.

## 2023-03-07 ENCOUNTER — Encounter: Payer: Self-pay | Admitting: Family Medicine

## 2023-03-07 ENCOUNTER — Ambulatory Visit (INDEPENDENT_AMBULATORY_CARE_PROVIDER_SITE_OTHER): Payer: Medicare Other | Admitting: Family Medicine

## 2023-03-07 VITALS — BP 138/78 | HR 63 | Temp 97.8°F | Ht 71.0 in | Wt 228.0 lb

## 2023-03-07 DIAGNOSIS — Z7189 Other specified counseling: Secondary | ICD-10-CM

## 2023-03-07 DIAGNOSIS — G8929 Other chronic pain: Secondary | ICD-10-CM

## 2023-03-07 DIAGNOSIS — M545 Low back pain, unspecified: Secondary | ICD-10-CM | POA: Diagnosis not present

## 2023-03-07 DIAGNOSIS — N529 Male erectile dysfunction, unspecified: Secondary | ICD-10-CM | POA: Diagnosis not present

## 2023-03-07 DIAGNOSIS — Z0000 Encounter for general adult medical examination without abnormal findings: Secondary | ICD-10-CM

## 2023-03-07 DIAGNOSIS — M9903 Segmental and somatic dysfunction of lumbar region: Secondary | ICD-10-CM | POA: Diagnosis not present

## 2023-03-07 DIAGNOSIS — M109 Gout, unspecified: Secondary | ICD-10-CM

## 2023-03-07 DIAGNOSIS — M9904 Segmental and somatic dysfunction of sacral region: Secondary | ICD-10-CM | POA: Diagnosis not present

## 2023-03-07 DIAGNOSIS — M9901 Segmental and somatic dysfunction of cervical region: Secondary | ICD-10-CM | POA: Diagnosis not present

## 2023-03-07 DIAGNOSIS — G4733 Obstructive sleep apnea (adult) (pediatric): Secondary | ICD-10-CM | POA: Diagnosis not present

## 2023-03-07 DIAGNOSIS — I10 Essential (primary) hypertension: Secondary | ICD-10-CM

## 2023-03-07 DIAGNOSIS — Z23 Encounter for immunization: Secondary | ICD-10-CM

## 2023-03-07 MED ORDER — COLCHICINE 0.6 MG PO TABS: ORAL_TABLET | ORAL | 3 refills | Status: DC

## 2023-03-07 MED ORDER — SILDENAFIL CITRATE 20 MG PO TABS: 20.0000 mg | ORAL_TABLET | Freq: Every day | ORAL | 12 refills | Status: DC | PRN

## 2023-03-07 MED ORDER — ALLOPURINOL 100 MG PO TABS: 100.0000 mg | ORAL_TABLET | Freq: Every day | ORAL | 3 refills | Status: DC

## 2023-03-07 MED ORDER — LOSARTAN POTASSIUM 25 MG PO TABS: 25.0000 mg | ORAL_TABLET | Freq: Every day | ORAL | 3 refills | Status: DC

## 2023-03-07 NOTE — Progress Notes (Unsigned)
I have personally reviewed the Medicare Annual Wellness questionnaire and have noted 1. The patient's medical and social history 2. Their use of alcohol, tobacco or illicit drugs 3. Their current medications and supplements 4. The patient's functional ability including ADL's, fall risks, home safety risks and hearing or visual             impairment. 5. Diet and physical activities 6. Evidence for depression or mood disorders  The patients weight, height, BMI have been recorded in the chart and visual acuity is per eye clinic.  I have made referrals, counseling and provided education to the patient based review of the above and I have provided the pt with a written personalized care plan for preventive services.  Provider list updated- see scanned forms.  Routine anticipatory guidance given to patient.  See health maintenance. The possibility exists that previously documented standard health maintenance information may have been brought forward from a previous encounter into this note.  If needed, that same information has been updated to reflect the current situation based on today's encounter.    Flu Shingles PNA Tetanus Colon  Breast cancer screening Prostate cancer screening Advance directive Cognitive function addressed- see scanned forms- and if abnormal then additional documentation follows.   In addition to [facility] Wellness, follow up visit for the below conditions:  Back and shoulder pain, going on for months.  Had prev eval for cervical radiculopathy and R back pain. Then saw ortho with MRI done.  He had eval with Dr. Drucie Ip and deferred injection.  He was doing HEP in the meantime for his back w/o relief.  Chiropractor eval done and back pain improved in the meantime. He is about 50% better.  R arm pain did get better prior to chiropractor eval.  He is going to continue with chiropractor.  He is taking ibuprofen if needed but usually gets by with tylenol.    Prev imaging  noted.   IMPRESSION: 1. Mild degenerative disc disease at C4-C5 and C5-C6. 2. Degenerative changes of left-sided facet joints at C3-C4, C4-C5, C5-C6 and C6-C7.  IMPRESSION: 1. Mild degenerative changes in the lower lumbar spine.  Gout. Allopurinol daily, rare prn of colchicine.  Doing well with that.    OSA treated with mouthguard and compliant.  Sleeping well with that.    Hypertension:    Using medication without problems or lightheadedness: yes Chest pain with exertion:no Edema:no Short of breath:no  ED.  Sildenafil worked, no ADE on med.    PMH and SH reviewed  Meds, vitals, and allergies reviewed.   ROS: Per HPI.  Unless specifically indicated otherwise in HPI, the patient denies:  General: fever. Eyes: acute vision changes ENT: sore throat Cardiovascular: chest pain Respiratory: SOB GI: vomiting GU: dysuria Musculoskeletal: acute back pain Derm: acute rash Neuro: acute motor dysfunction Psych: worsening mood Endocrine: polydipsia Heme: bleeding Allergy: hayfever  GEN: nad, alert and oriented HEENT: mucous membranes moist NECK: supple w/o LA CV: rrr. PULM: ctab, no inc wob ABD: soft, +bs EXT: no edema SKIN: no acute rash

## 2023-03-07 NOTE — Patient Instructions (Addendum)
Tetanus shot should be cheaper at the pharmacy.   Take care.  Glad to see you. Update me as needed.

## 2023-03-09 NOTE — Assessment & Plan Note (Signed)
Continue sildenafil as needed. 

## 2023-03-09 NOTE — Assessment & Plan Note (Signed)
Flu 2024 Shingles previously done at [facility] per patient report PNA previously done Tetanus 2013 COVID-vaccine previously done. RSV vaccine discussed with patient. Cologuard 2022 Prostate cancer screening 2024 Advance directive-wife designated if patient were incapacitated Cognitive function addressed- see scanned forms- and if abnormal then additional documentation follows.

## 2023-03-09 NOTE — Assessment & Plan Note (Signed)
Continue losartan.  Labs discussed with patient.  Continue work on diet and exercise.  I thanked him for his effort.

## 2023-03-09 NOTE — Assessment & Plan Note (Signed)
Continue allopurinol.  Rare colchicine need/use.

## 2023-03-09 NOTE — Assessment & Plan Note (Signed)
Advance directive- wife designated if patient were incapacitated.  

## 2023-03-09 NOTE — Assessment & Plan Note (Signed)
Chiropractor eval done and back pain improved in the meantime. He is about 50% better.  R arm pain did get better prior to chiropractor eval.  He is going to continue with chiropractor.  He is taking ibuprofen if needed but usually gets by with tylenol.    Prev imaging noted.   IMPRESSION: 1. Mild degenerative disc disease at C4-C5 and C5-C6. 2. Degenerative changes of left-sided facet joints at C3-C4, C4-C5, C5-C6 and C6-C7.  IMPRESSION: 1. Mild degenerative changes in the lower lumbar spine.  He is getting better with chiropractic treatment and minimal Tylenol/ibuprofen use.  I would continue as is.

## 2023-03-09 NOTE — Assessment & Plan Note (Signed)
OSA treated with mouthguard and compliant.  Sleeping well with that.   Continue as is.

## 2023-03-10 DIAGNOSIS — M545 Low back pain, unspecified: Secondary | ICD-10-CM | POA: Diagnosis not present

## 2023-03-10 DIAGNOSIS — M9903 Segmental and somatic dysfunction of lumbar region: Secondary | ICD-10-CM | POA: Diagnosis not present

## 2023-03-10 DIAGNOSIS — M9904 Segmental and somatic dysfunction of sacral region: Secondary | ICD-10-CM | POA: Diagnosis not present

## 2023-03-10 DIAGNOSIS — M9901 Segmental and somatic dysfunction of cervical region: Secondary | ICD-10-CM | POA: Diagnosis not present

## 2023-03-14 DIAGNOSIS — M9901 Segmental and somatic dysfunction of cervical region: Secondary | ICD-10-CM | POA: Diagnosis not present

## 2023-03-14 DIAGNOSIS — M545 Low back pain, unspecified: Secondary | ICD-10-CM | POA: Diagnosis not present

## 2023-03-14 DIAGNOSIS — M9904 Segmental and somatic dysfunction of sacral region: Secondary | ICD-10-CM | POA: Diagnosis not present

## 2023-03-14 DIAGNOSIS — M9903 Segmental and somatic dysfunction of lumbar region: Secondary | ICD-10-CM | POA: Diagnosis not present

## 2023-03-17 DIAGNOSIS — M545 Low back pain, unspecified: Secondary | ICD-10-CM | POA: Diagnosis not present

## 2023-03-17 DIAGNOSIS — M9904 Segmental and somatic dysfunction of sacral region: Secondary | ICD-10-CM | POA: Diagnosis not present

## 2023-03-17 DIAGNOSIS — M9903 Segmental and somatic dysfunction of lumbar region: Secondary | ICD-10-CM | POA: Diagnosis not present

## 2023-03-17 DIAGNOSIS — M9901 Segmental and somatic dysfunction of cervical region: Secondary | ICD-10-CM | POA: Diagnosis not present

## 2023-03-21 DIAGNOSIS — M545 Low back pain, unspecified: Secondary | ICD-10-CM | POA: Diagnosis not present

## 2023-03-21 DIAGNOSIS — M9903 Segmental and somatic dysfunction of lumbar region: Secondary | ICD-10-CM | POA: Diagnosis not present

## 2023-03-21 DIAGNOSIS — M9901 Segmental and somatic dysfunction of cervical region: Secondary | ICD-10-CM | POA: Diagnosis not present

## 2023-03-21 DIAGNOSIS — M9904 Segmental and somatic dysfunction of sacral region: Secondary | ICD-10-CM | POA: Diagnosis not present

## 2023-03-28 DIAGNOSIS — M9904 Segmental and somatic dysfunction of sacral region: Secondary | ICD-10-CM | POA: Diagnosis not present

## 2023-03-28 DIAGNOSIS — M9901 Segmental and somatic dysfunction of cervical region: Secondary | ICD-10-CM | POA: Diagnosis not present

## 2023-03-28 DIAGNOSIS — M9903 Segmental and somatic dysfunction of lumbar region: Secondary | ICD-10-CM | POA: Diagnosis not present

## 2023-03-28 DIAGNOSIS — M545 Low back pain, unspecified: Secondary | ICD-10-CM | POA: Diagnosis not present

## 2023-04-04 DIAGNOSIS — M9904 Segmental and somatic dysfunction of sacral region: Secondary | ICD-10-CM | POA: Diagnosis not present

## 2023-04-04 DIAGNOSIS — M9903 Segmental and somatic dysfunction of lumbar region: Secondary | ICD-10-CM | POA: Diagnosis not present

## 2023-04-04 DIAGNOSIS — M9901 Segmental and somatic dysfunction of cervical region: Secondary | ICD-10-CM | POA: Diagnosis not present

## 2023-04-04 DIAGNOSIS — M545 Low back pain, unspecified: Secondary | ICD-10-CM | POA: Diagnosis not present

## 2023-04-05 IMAGING — CR DG FOOT COMPLETE 3+V*L*
1 series · 3 of 3 positions shown · non-contrast
Comparison: None Available.

CLINICAL DATA: Pain and swelling

EXAM:
LEFT FOOT - COMPLETE 3+ VIEW

[Series 1: dg foot complete left · 0.14mm/px · 3 of 3 slices shown]
[im 1/3]
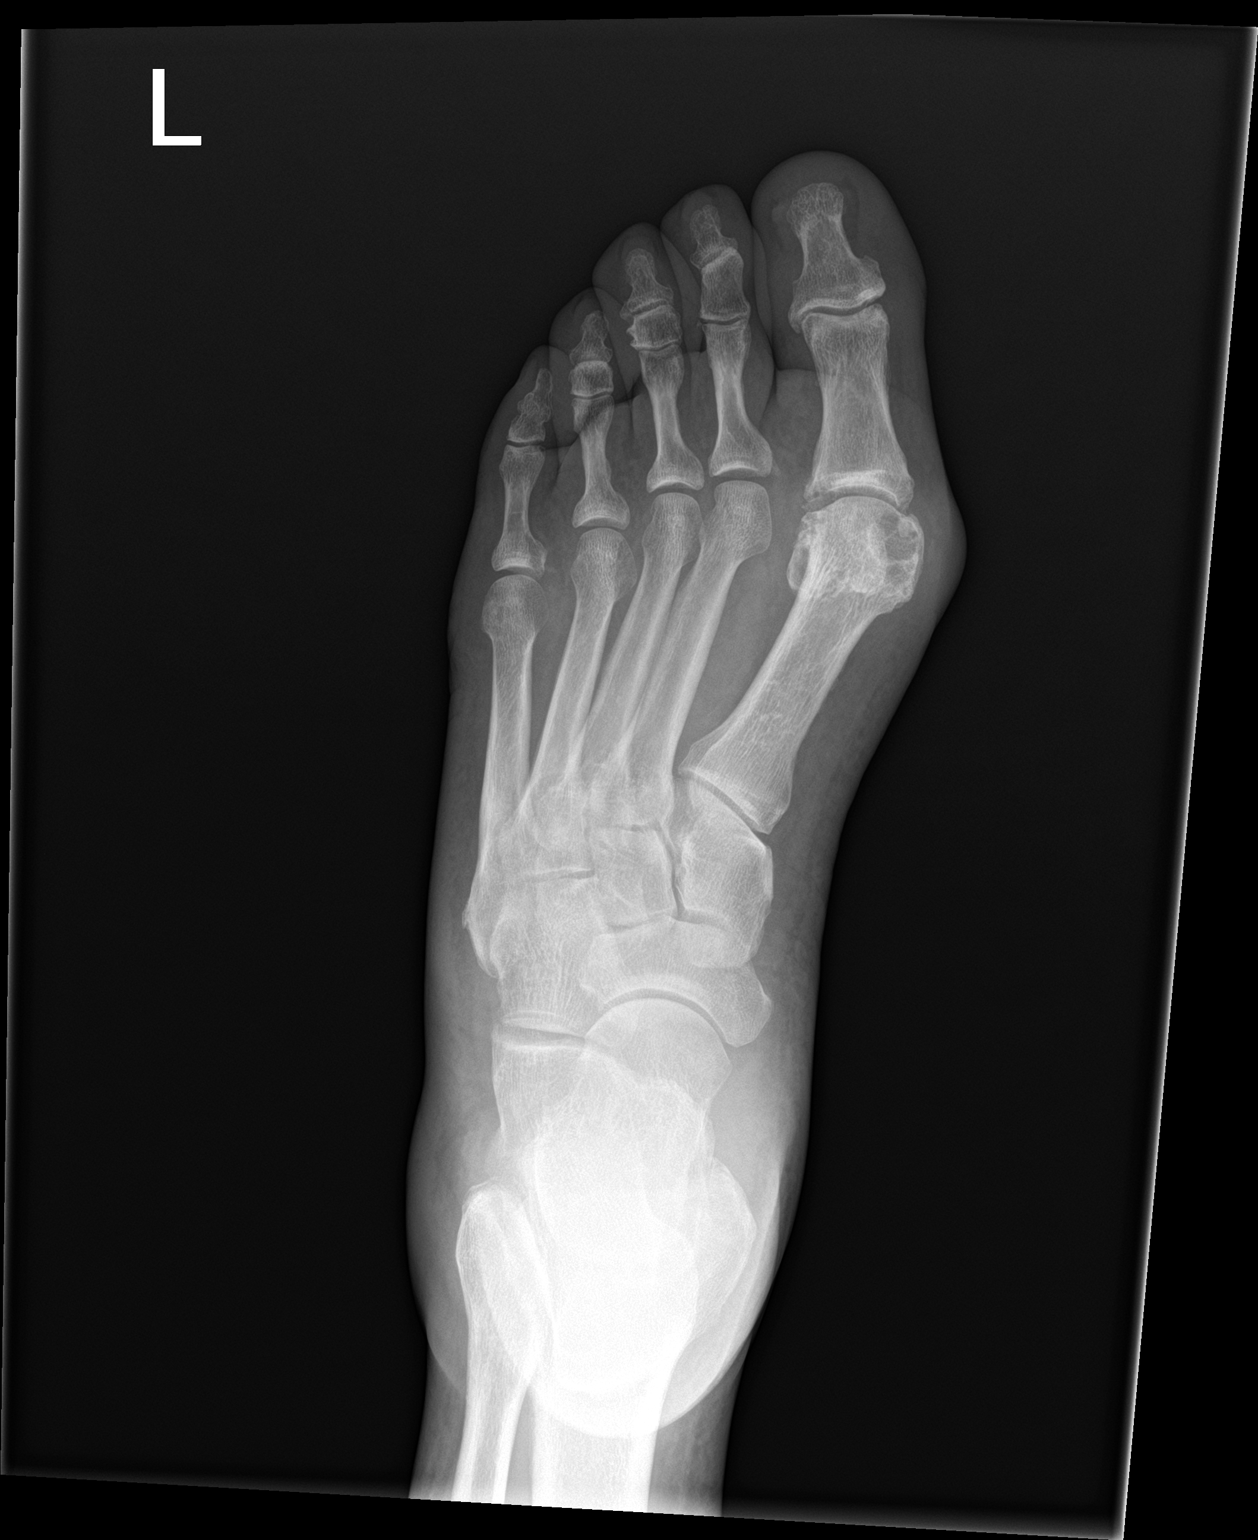
[im 2/3]
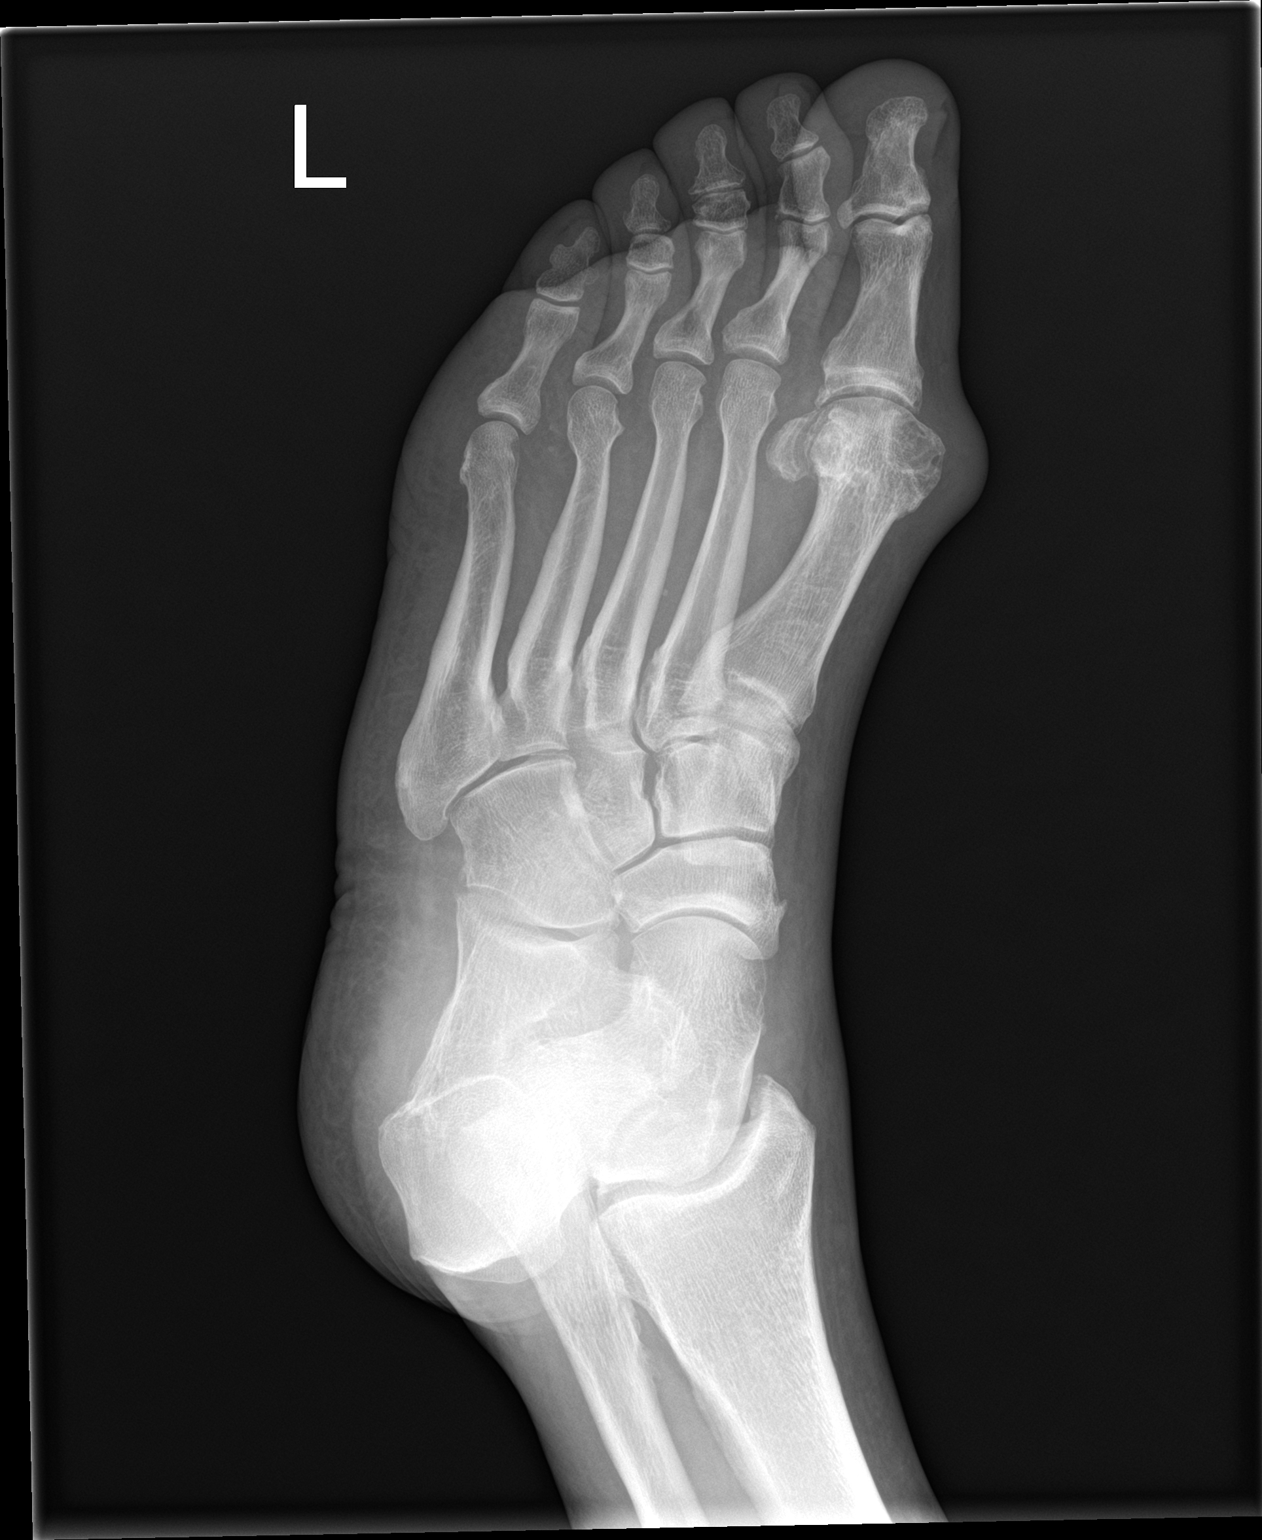
[im 3/3]
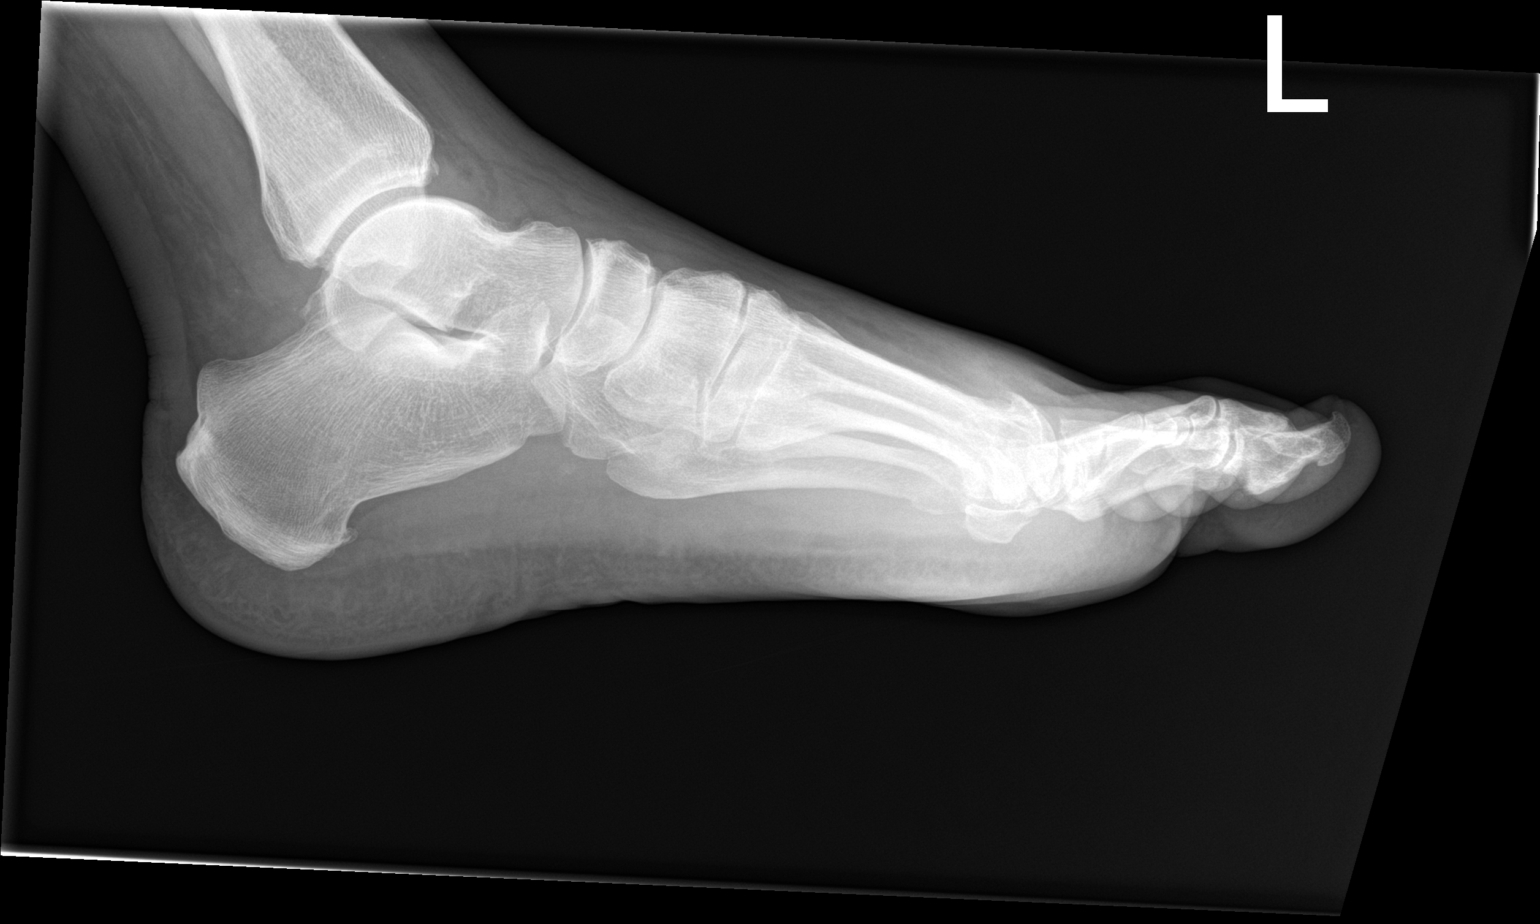

[3 of 3 positions shown; findings below may reference images not displayed]

FINDINGS: No recent fracture or dislocation is seen. Degenerative changes are
noted in first metatarsophalangeal joint with bony spurs and
subcortical cysts. Hallux valgus deformity is noted. There is soft
tissue swelling over the medial aspect of first metatarsophalangeal
joint.
IMPRESSION: No recent fracture or dislocation is seen in the left foot.
Degenerative changes are noted in first metatarsophalangeal joint.
Hallux valgus deformity is noted.

## 2023-04-05 IMAGING — CR DG FOOT COMPLETE 3+V*R*
1 series · 3 of 3 positions shown · non-contrast
Comparison: None Available.

CLINICAL DATA: Pain and swelling

EXAM:
RIGHT FOOT COMPLETE - 3+ VIEW

[Series 1: dg foot complete right · 0.14mm/px · 3 of 3 slices shown]
[im 1/3]
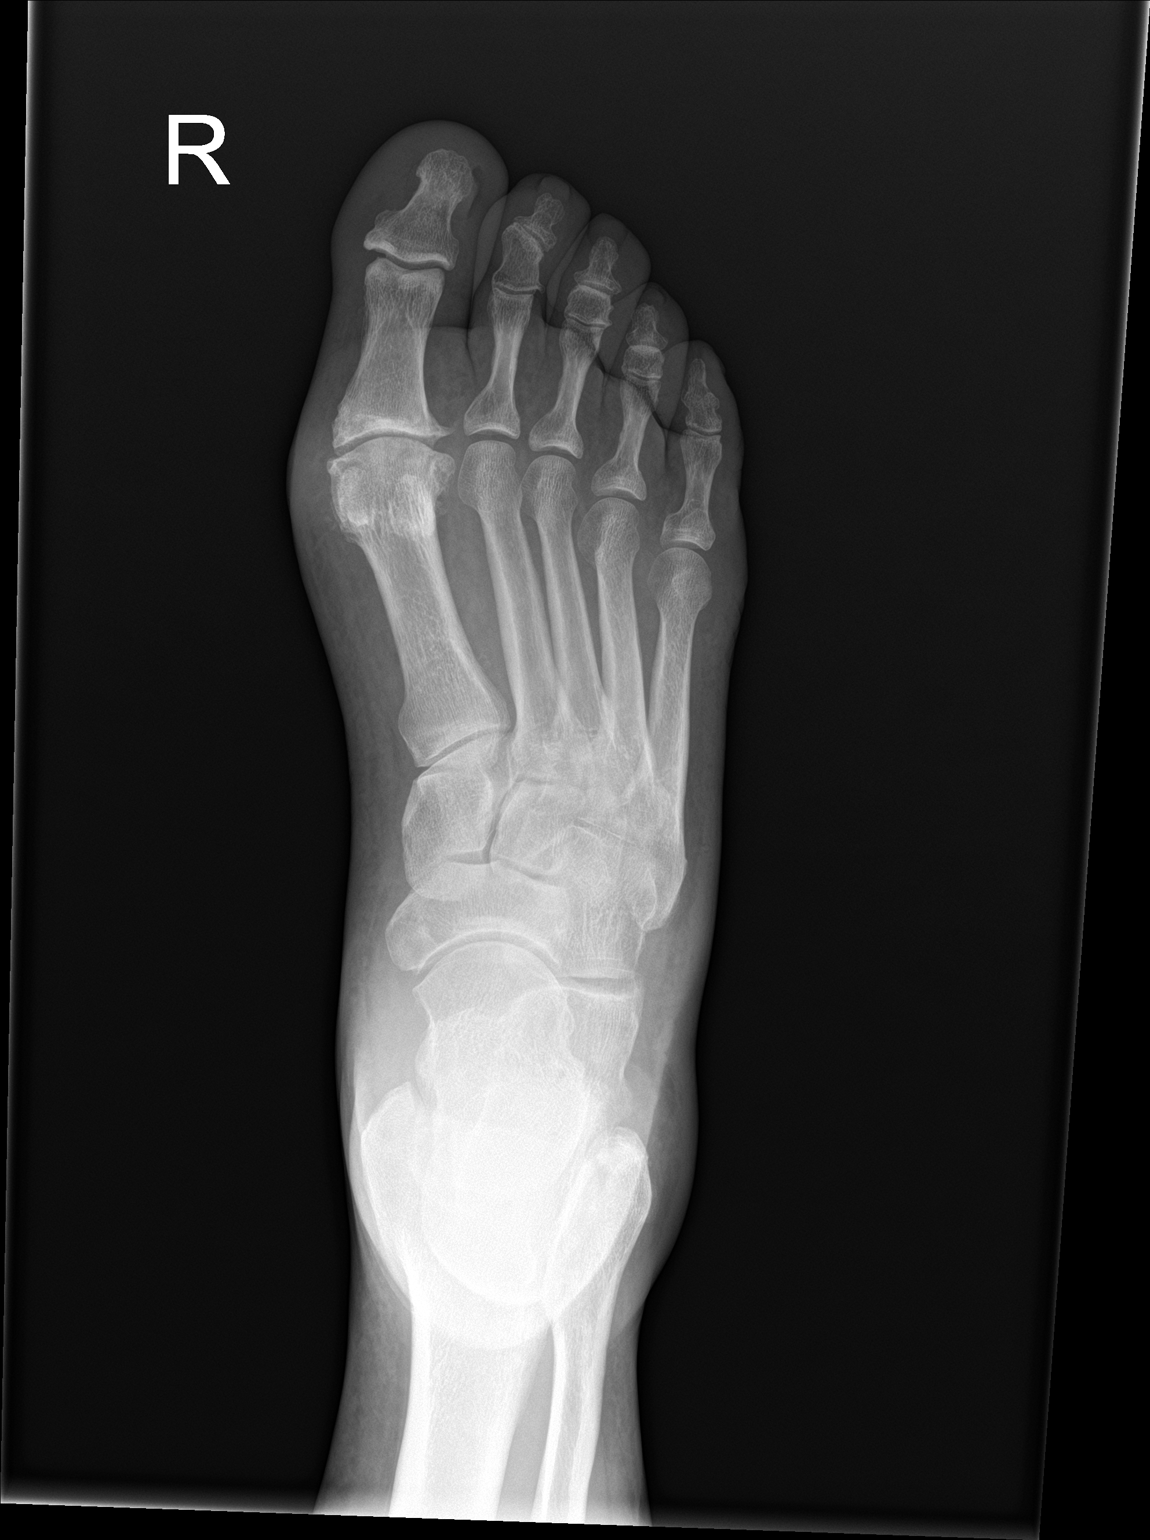
[im 2/3]
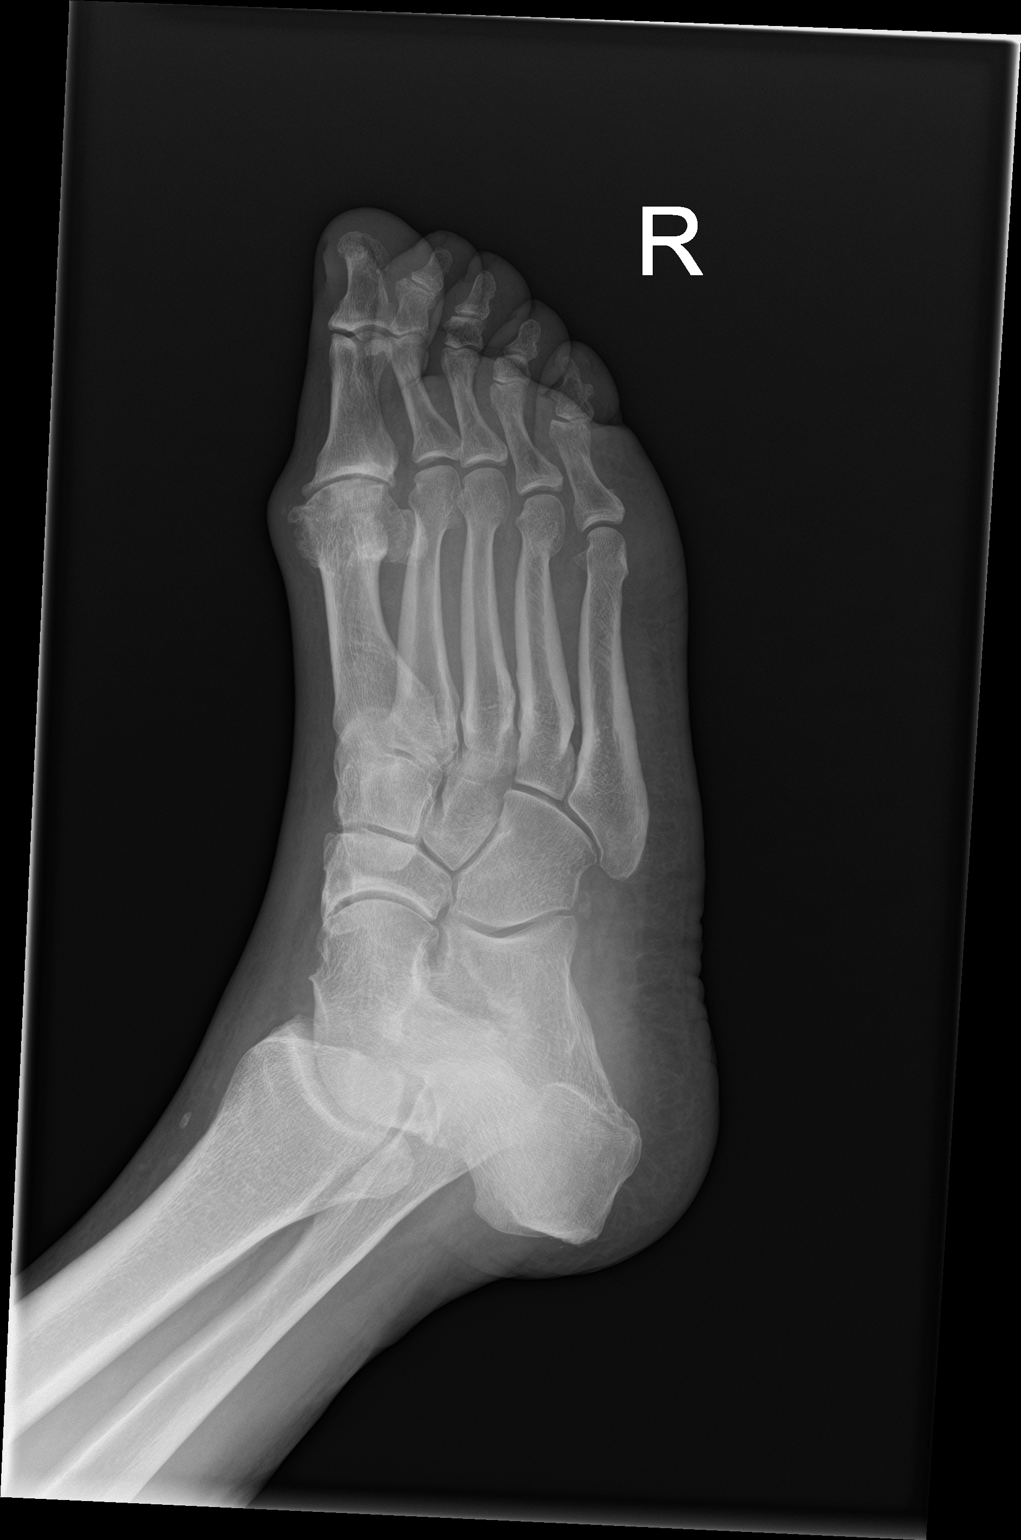
[im 3/3]
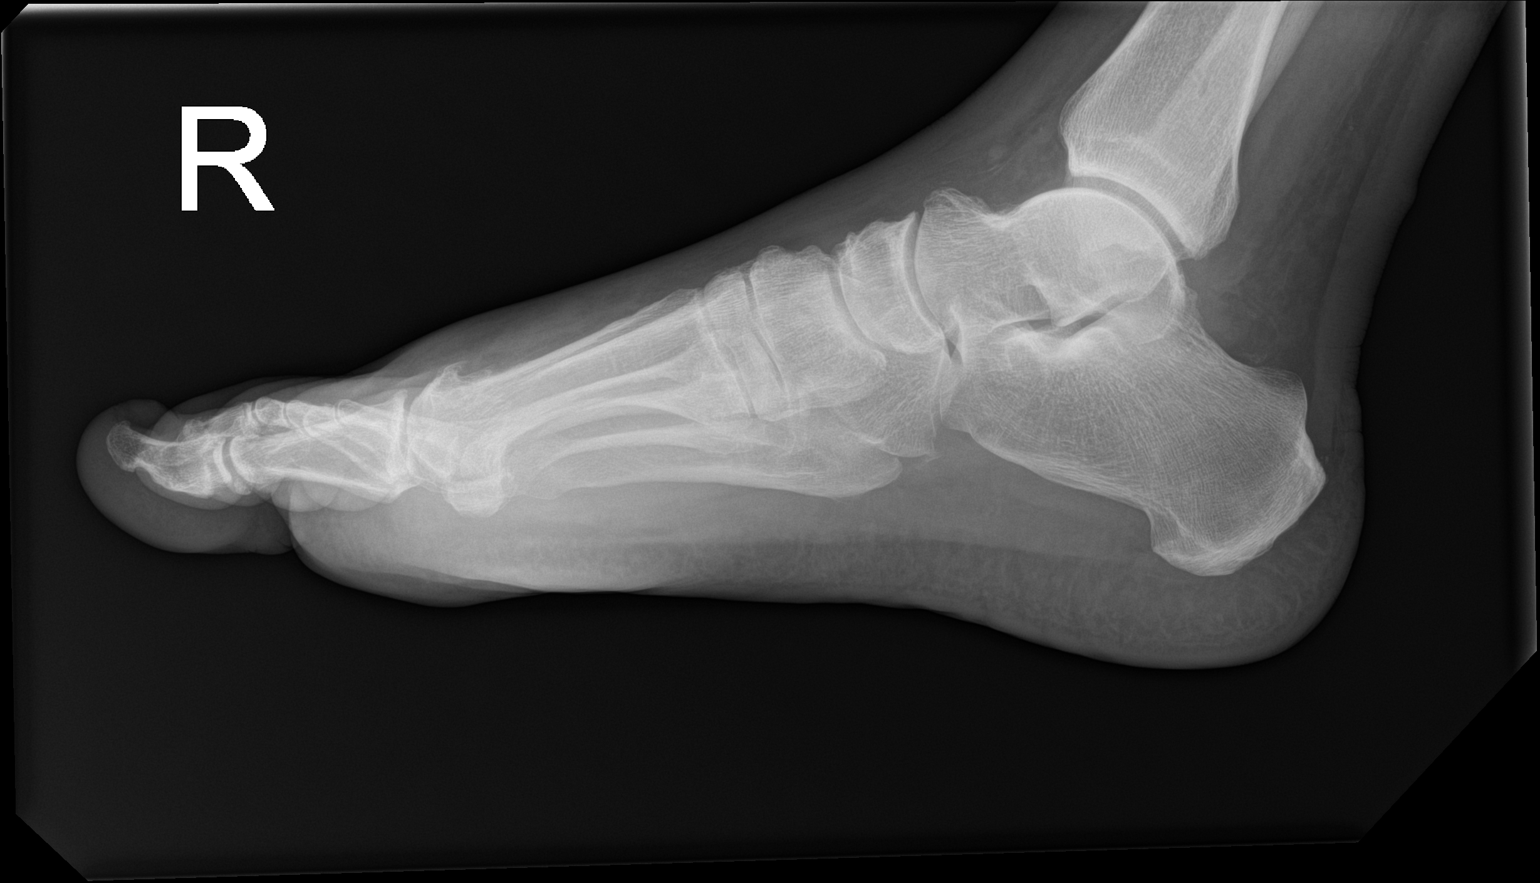

[3 of 3 positions shown; findings below may reference images not displayed]

FINDINGS: No recent fracture or dislocation is seen. Significant degenerative
changes are noted in first metatarsophalangeal joint with joint
space narrowing and bony spurs. Bony spurs are noted in the dorsal
aspect of intertarsal joints seen in the lateral view.
IMPRESSION: No recent fracture or dislocation is seen in the right foot.
Significant degenerative changes are noted in the right first
metatarsophalangeal joint.

## 2023-04-11 DIAGNOSIS — M9904 Segmental and somatic dysfunction of sacral region: Secondary | ICD-10-CM | POA: Diagnosis not present

## 2023-04-11 DIAGNOSIS — M545 Low back pain, unspecified: Secondary | ICD-10-CM | POA: Diagnosis not present

## 2023-04-11 DIAGNOSIS — M9903 Segmental and somatic dysfunction of lumbar region: Secondary | ICD-10-CM | POA: Diagnosis not present

## 2023-04-11 DIAGNOSIS — M9901 Segmental and somatic dysfunction of cervical region: Secondary | ICD-10-CM | POA: Diagnosis not present

## 2023-04-18 DIAGNOSIS — M9904 Segmental and somatic dysfunction of sacral region: Secondary | ICD-10-CM | POA: Diagnosis not present

## 2023-04-18 DIAGNOSIS — M9903 Segmental and somatic dysfunction of lumbar region: Secondary | ICD-10-CM | POA: Diagnosis not present

## 2023-04-18 DIAGNOSIS — M545 Low back pain, unspecified: Secondary | ICD-10-CM | POA: Diagnosis not present

## 2023-04-18 DIAGNOSIS — M9901 Segmental and somatic dysfunction of cervical region: Secondary | ICD-10-CM | POA: Diagnosis not present

## 2023-04-23 ENCOUNTER — Other Ambulatory Visit: Payer: Self-pay | Admitting: Family Medicine

## 2023-04-23 DIAGNOSIS — M109 Gout, unspecified: Secondary | ICD-10-CM

## 2023-04-25 DIAGNOSIS — M9903 Segmental and somatic dysfunction of lumbar region: Secondary | ICD-10-CM | POA: Diagnosis not present

## 2023-04-25 DIAGNOSIS — M9904 Segmental and somatic dysfunction of sacral region: Secondary | ICD-10-CM | POA: Diagnosis not present

## 2023-04-25 DIAGNOSIS — M545 Low back pain, unspecified: Secondary | ICD-10-CM | POA: Diagnosis not present

## 2023-04-25 DIAGNOSIS — M9901 Segmental and somatic dysfunction of cervical region: Secondary | ICD-10-CM | POA: Diagnosis not present

## 2023-05-02 DIAGNOSIS — M9901 Segmental and somatic dysfunction of cervical region: Secondary | ICD-10-CM | POA: Diagnosis not present

## 2023-05-02 DIAGNOSIS — M9903 Segmental and somatic dysfunction of lumbar region: Secondary | ICD-10-CM | POA: Diagnosis not present

## 2023-05-02 DIAGNOSIS — M9904 Segmental and somatic dysfunction of sacral region: Secondary | ICD-10-CM | POA: Diagnosis not present

## 2023-05-02 DIAGNOSIS — M545 Low back pain, unspecified: Secondary | ICD-10-CM | POA: Diagnosis not present

## 2023-05-09 DIAGNOSIS — M9903 Segmental and somatic dysfunction of lumbar region: Secondary | ICD-10-CM | POA: Diagnosis not present

## 2023-05-09 DIAGNOSIS — M9901 Segmental and somatic dysfunction of cervical region: Secondary | ICD-10-CM | POA: Diagnosis not present

## 2023-05-09 DIAGNOSIS — M545 Low back pain, unspecified: Secondary | ICD-10-CM | POA: Diagnosis not present

## 2023-05-09 DIAGNOSIS — M9904 Segmental and somatic dysfunction of sacral region: Secondary | ICD-10-CM | POA: Diagnosis not present

## 2023-05-16 DIAGNOSIS — M9901 Segmental and somatic dysfunction of cervical region: Secondary | ICD-10-CM | POA: Diagnosis not present

## 2023-05-16 DIAGNOSIS — M9903 Segmental and somatic dysfunction of lumbar region: Secondary | ICD-10-CM | POA: Diagnosis not present

## 2023-05-16 DIAGNOSIS — M9904 Segmental and somatic dysfunction of sacral region: Secondary | ICD-10-CM | POA: Diagnosis not present

## 2023-05-16 DIAGNOSIS — M545 Low back pain, unspecified: Secondary | ICD-10-CM | POA: Diagnosis not present

## 2023-05-23 ENCOUNTER — Other Ambulatory Visit: Payer: Self-pay | Admitting: Family Medicine

## 2023-05-23 DIAGNOSIS — I10 Essential (primary) hypertension: Secondary | ICD-10-CM

## 2023-05-23 DIAGNOSIS — M9903 Segmental and somatic dysfunction of lumbar region: Secondary | ICD-10-CM | POA: Diagnosis not present

## 2023-05-23 DIAGNOSIS — M9904 Segmental and somatic dysfunction of sacral region: Secondary | ICD-10-CM | POA: Diagnosis not present

## 2023-05-23 DIAGNOSIS — M9901 Segmental and somatic dysfunction of cervical region: Secondary | ICD-10-CM | POA: Diagnosis not present

## 2023-05-23 DIAGNOSIS — M545 Low back pain, unspecified: Secondary | ICD-10-CM | POA: Diagnosis not present

## 2023-05-28 ENCOUNTER — Encounter: Payer: Self-pay | Admitting: Family Medicine

## 2023-05-30 ENCOUNTER — Telehealth: Payer: Self-pay | Admitting: Family Medicine

## 2023-05-30 DIAGNOSIS — M9901 Segmental and somatic dysfunction of cervical region: Secondary | ICD-10-CM | POA: Diagnosis not present

## 2023-05-30 DIAGNOSIS — M9904 Segmental and somatic dysfunction of sacral region: Secondary | ICD-10-CM | POA: Diagnosis not present

## 2023-05-30 DIAGNOSIS — M9903 Segmental and somatic dysfunction of lumbar region: Secondary | ICD-10-CM | POA: Diagnosis not present

## 2023-05-30 DIAGNOSIS — M545 Low back pain, unspecified: Secondary | ICD-10-CM | POA: Diagnosis not present

## 2023-05-30 NOTE — Telephone Encounter (Signed)
 I spoke with pt; pt said since 05/29/23 he is feeling much better; today except for raspy voice (throat is not sore) pt said all symptoms have subsided. No cough, fever or H/A. Pt said he feels better by the minute and does not want to schedule appt. Pt will cb if appt needed. UC & ED precautions given and pt voiced understanding and appreciative of call. Sending note to Dr Para March.

## 2023-05-30 NOTE — Telephone Encounter (Signed)
 See mychart message and please triage patient about current sx.  Thanks.

## 2023-05-30 NOTE — Telephone Encounter (Signed)
Noted.  Thanks.  Glad to hear.  ? ?

## 2023-05-30 NOTE — Telephone Encounter (Signed)
 Unable to reach pt by phone; left v/m requesting pt to call [phone]. Will send note to pt via mychart note on 05/28/23  I did speak with pts wife and she said pt went to work after lunch and to call pts cell. Pt wife will have her husband call [phone] if he does not speak with our office by the time pt gets home. Sending FYI to Dr Para March, Para March pool anda triage.

## 2023-06-06 DIAGNOSIS — M9904 Segmental and somatic dysfunction of sacral region: Secondary | ICD-10-CM | POA: Diagnosis not present

## 2023-06-06 DIAGNOSIS — M9903 Segmental and somatic dysfunction of lumbar region: Secondary | ICD-10-CM | POA: Diagnosis not present

## 2023-06-06 DIAGNOSIS — M545 Low back pain, unspecified: Secondary | ICD-10-CM | POA: Diagnosis not present

## 2023-06-06 DIAGNOSIS — M9901 Segmental and somatic dysfunction of cervical region: Secondary | ICD-10-CM | POA: Diagnosis not present

## 2023-06-13 DIAGNOSIS — D225 Melanocytic nevi of trunk: Secondary | ICD-10-CM | POA: Diagnosis not present

## 2023-06-13 DIAGNOSIS — D2272 Melanocytic nevi of left lower limb, including hip: Secondary | ICD-10-CM | POA: Diagnosis not present

## 2023-06-13 DIAGNOSIS — M9903 Segmental and somatic dysfunction of lumbar region: Secondary | ICD-10-CM | POA: Diagnosis not present

## 2023-06-13 DIAGNOSIS — M9904 Segmental and somatic dysfunction of sacral region: Secondary | ICD-10-CM | POA: Diagnosis not present

## 2023-06-13 DIAGNOSIS — M545 Low back pain, unspecified: Secondary | ICD-10-CM | POA: Diagnosis not present

## 2023-06-13 DIAGNOSIS — M9901 Segmental and somatic dysfunction of cervical region: Secondary | ICD-10-CM | POA: Diagnosis not present

## 2023-06-13 DIAGNOSIS — D2261 Melanocytic nevi of right upper limb, including shoulder: Secondary | ICD-10-CM | POA: Diagnosis not present

## 2023-06-13 DIAGNOSIS — D2262 Melanocytic nevi of left upper limb, including shoulder: Secondary | ICD-10-CM | POA: Diagnosis not present

## 2023-06-20 DIAGNOSIS — M9903 Segmental and somatic dysfunction of lumbar region: Secondary | ICD-10-CM | POA: Diagnosis not present

## 2023-06-20 DIAGNOSIS — M9904 Segmental and somatic dysfunction of sacral region: Secondary | ICD-10-CM | POA: Diagnosis not present

## 2023-06-20 DIAGNOSIS — M9901 Segmental and somatic dysfunction of cervical region: Secondary | ICD-10-CM | POA: Diagnosis not present

## 2023-06-20 DIAGNOSIS — M545 Low back pain, unspecified: Secondary | ICD-10-CM | POA: Diagnosis not present

## 2023-06-27 DIAGNOSIS — M9901 Segmental and somatic dysfunction of cervical region: Secondary | ICD-10-CM | POA: Diagnosis not present

## 2023-06-27 DIAGNOSIS — M545 Low back pain, unspecified: Secondary | ICD-10-CM | POA: Diagnosis not present

## 2023-06-27 DIAGNOSIS — M9903 Segmental and somatic dysfunction of lumbar region: Secondary | ICD-10-CM | POA: Diagnosis not present

## 2023-06-27 DIAGNOSIS — M9904 Segmental and somatic dysfunction of sacral region: Secondary | ICD-10-CM | POA: Diagnosis not present

## 2023-07-04 DIAGNOSIS — M9901 Segmental and somatic dysfunction of cervical region: Secondary | ICD-10-CM | POA: Diagnosis not present

## 2023-07-04 DIAGNOSIS — M9903 Segmental and somatic dysfunction of lumbar region: Secondary | ICD-10-CM | POA: Diagnosis not present

## 2023-07-04 DIAGNOSIS — M545 Low back pain, unspecified: Secondary | ICD-10-CM | POA: Diagnosis not present

## 2023-07-04 DIAGNOSIS — M9904 Segmental and somatic dysfunction of sacral region: Secondary | ICD-10-CM | POA: Diagnosis not present

## 2023-07-12 DIAGNOSIS — M9903 Segmental and somatic dysfunction of lumbar region: Secondary | ICD-10-CM | POA: Diagnosis not present

## 2023-07-12 DIAGNOSIS — M545 Low back pain, unspecified: Secondary | ICD-10-CM | POA: Diagnosis not present

## 2023-07-12 DIAGNOSIS — M9901 Segmental and somatic dysfunction of cervical region: Secondary | ICD-10-CM | POA: Diagnosis not present

## 2023-07-12 DIAGNOSIS — M9904 Segmental and somatic dysfunction of sacral region: Secondary | ICD-10-CM | POA: Diagnosis not present

## 2023-07-18 DIAGNOSIS — M9904 Segmental and somatic dysfunction of sacral region: Secondary | ICD-10-CM | POA: Diagnosis not present

## 2023-07-18 DIAGNOSIS — M9903 Segmental and somatic dysfunction of lumbar region: Secondary | ICD-10-CM | POA: Diagnosis not present

## 2023-07-18 DIAGNOSIS — M9901 Segmental and somatic dysfunction of cervical region: Secondary | ICD-10-CM | POA: Diagnosis not present

## 2023-07-18 DIAGNOSIS — M545 Low back pain, unspecified: Secondary | ICD-10-CM | POA: Diagnosis not present

## 2023-07-25 DIAGNOSIS — M9901 Segmental and somatic dysfunction of cervical region: Secondary | ICD-10-CM | POA: Diagnosis not present

## 2023-07-25 DIAGNOSIS — M545 Low back pain, unspecified: Secondary | ICD-10-CM | POA: Diagnosis not present

## 2023-07-25 DIAGNOSIS — M9904 Segmental and somatic dysfunction of sacral region: Secondary | ICD-10-CM | POA: Diagnosis not present

## 2023-07-25 DIAGNOSIS — M9903 Segmental and somatic dysfunction of lumbar region: Secondary | ICD-10-CM | POA: Diagnosis not present

## 2023-08-01 DIAGNOSIS — M9904 Segmental and somatic dysfunction of sacral region: Secondary | ICD-10-CM | POA: Diagnosis not present

## 2023-08-01 DIAGNOSIS — M9901 Segmental and somatic dysfunction of cervical region: Secondary | ICD-10-CM | POA: Diagnosis not present

## 2023-08-01 DIAGNOSIS — M9903 Segmental and somatic dysfunction of lumbar region: Secondary | ICD-10-CM | POA: Diagnosis not present

## 2023-08-01 DIAGNOSIS — M545 Low back pain, unspecified: Secondary | ICD-10-CM | POA: Diagnosis not present

## 2023-08-08 DIAGNOSIS — M9903 Segmental and somatic dysfunction of lumbar region: Secondary | ICD-10-CM | POA: Diagnosis not present

## 2023-08-08 DIAGNOSIS — M9901 Segmental and somatic dysfunction of cervical region: Secondary | ICD-10-CM | POA: Diagnosis not present

## 2023-08-08 DIAGNOSIS — M9904 Segmental and somatic dysfunction of sacral region: Secondary | ICD-10-CM | POA: Diagnosis not present

## 2023-08-08 DIAGNOSIS — M545 Low back pain, unspecified: Secondary | ICD-10-CM | POA: Diagnosis not present

## 2023-08-15 DIAGNOSIS — M9904 Segmental and somatic dysfunction of sacral region: Secondary | ICD-10-CM | POA: Diagnosis not present

## 2023-08-15 DIAGNOSIS — M545 Low back pain, unspecified: Secondary | ICD-10-CM | POA: Diagnosis not present

## 2023-08-15 DIAGNOSIS — M9903 Segmental and somatic dysfunction of lumbar region: Secondary | ICD-10-CM | POA: Diagnosis not present

## 2023-08-15 DIAGNOSIS — M9901 Segmental and somatic dysfunction of cervical region: Secondary | ICD-10-CM | POA: Diagnosis not present

## 2023-08-22 DIAGNOSIS — M9903 Segmental and somatic dysfunction of lumbar region: Secondary | ICD-10-CM | POA: Diagnosis not present

## 2023-08-22 DIAGNOSIS — M9904 Segmental and somatic dysfunction of sacral region: Secondary | ICD-10-CM | POA: Diagnosis not present

## 2023-08-22 DIAGNOSIS — M545 Low back pain, unspecified: Secondary | ICD-10-CM | POA: Diagnosis not present

## 2023-08-22 DIAGNOSIS — M9901 Segmental and somatic dysfunction of cervical region: Secondary | ICD-10-CM | POA: Diagnosis not present

## 2023-09-05 DIAGNOSIS — M9901 Segmental and somatic dysfunction of cervical region: Secondary | ICD-10-CM | POA: Diagnosis not present

## 2023-09-05 DIAGNOSIS — M9903 Segmental and somatic dysfunction of lumbar region: Secondary | ICD-10-CM | POA: Diagnosis not present

## 2023-09-05 DIAGNOSIS — M9904 Segmental and somatic dysfunction of sacral region: Secondary | ICD-10-CM | POA: Diagnosis not present

## 2023-09-05 DIAGNOSIS — M545 Low back pain, unspecified: Secondary | ICD-10-CM | POA: Diagnosis not present

## 2023-09-12 DIAGNOSIS — M9903 Segmental and somatic dysfunction of lumbar region: Secondary | ICD-10-CM | POA: Diagnosis not present

## 2023-09-12 DIAGNOSIS — M545 Low back pain, unspecified: Secondary | ICD-10-CM | POA: Diagnosis not present

## 2023-09-12 DIAGNOSIS — M9901 Segmental and somatic dysfunction of cervical region: Secondary | ICD-10-CM | POA: Diagnosis not present

## 2023-09-12 DIAGNOSIS — M9904 Segmental and somatic dysfunction of sacral region: Secondary | ICD-10-CM | POA: Diagnosis not present

## 2023-09-29 DIAGNOSIS — M545 Low back pain, unspecified: Secondary | ICD-10-CM | POA: Diagnosis not present

## 2023-09-29 DIAGNOSIS — M9901 Segmental and somatic dysfunction of cervical region: Secondary | ICD-10-CM | POA: Diagnosis not present

## 2023-09-29 DIAGNOSIS — M9904 Segmental and somatic dysfunction of sacral region: Secondary | ICD-10-CM | POA: Diagnosis not present

## 2023-09-29 DIAGNOSIS — M9903 Segmental and somatic dysfunction of lumbar region: Secondary | ICD-10-CM | POA: Diagnosis not present

## 2023-10-06 DIAGNOSIS — M545 Low back pain, unspecified: Secondary | ICD-10-CM | POA: Diagnosis not present

## 2023-10-06 DIAGNOSIS — M9903 Segmental and somatic dysfunction of lumbar region: Secondary | ICD-10-CM | POA: Diagnosis not present

## 2023-10-06 DIAGNOSIS — M9904 Segmental and somatic dysfunction of sacral region: Secondary | ICD-10-CM | POA: Diagnosis not present

## 2023-10-06 DIAGNOSIS — M9901 Segmental and somatic dysfunction of cervical region: Secondary | ICD-10-CM | POA: Diagnosis not present

## 2023-10-20 DIAGNOSIS — M9903 Segmental and somatic dysfunction of lumbar region: Secondary | ICD-10-CM | POA: Diagnosis not present

## 2023-10-20 DIAGNOSIS — M9904 Segmental and somatic dysfunction of sacral region: Secondary | ICD-10-CM | POA: Diagnosis not present

## 2023-10-20 DIAGNOSIS — M9901 Segmental and somatic dysfunction of cervical region: Secondary | ICD-10-CM | POA: Diagnosis not present

## 2023-10-20 DIAGNOSIS — M545 Low back pain, unspecified: Secondary | ICD-10-CM | POA: Diagnosis not present

## 2023-10-27 DIAGNOSIS — M545 Low back pain, unspecified: Secondary | ICD-10-CM | POA: Diagnosis not present

## 2023-10-27 DIAGNOSIS — M9903 Segmental and somatic dysfunction of lumbar region: Secondary | ICD-10-CM | POA: Diagnosis not present

## 2023-10-27 DIAGNOSIS — M9901 Segmental and somatic dysfunction of cervical region: Secondary | ICD-10-CM | POA: Diagnosis not present

## 2023-10-27 DIAGNOSIS — M9904 Segmental and somatic dysfunction of sacral region: Secondary | ICD-10-CM | POA: Diagnosis not present

## 2023-11-03 DIAGNOSIS — M9901 Segmental and somatic dysfunction of cervical region: Secondary | ICD-10-CM | POA: Diagnosis not present

## 2023-11-03 DIAGNOSIS — M9904 Segmental and somatic dysfunction of sacral region: Secondary | ICD-10-CM | POA: Diagnosis not present

## 2023-11-03 DIAGNOSIS — M9903 Segmental and somatic dysfunction of lumbar region: Secondary | ICD-10-CM | POA: Diagnosis not present

## 2023-11-03 DIAGNOSIS — M545 Low back pain, unspecified: Secondary | ICD-10-CM | POA: Diagnosis not present

## 2023-11-17 DIAGNOSIS — M9901 Segmental and somatic dysfunction of cervical region: Secondary | ICD-10-CM | POA: Diagnosis not present

## 2023-11-17 DIAGNOSIS — M545 Low back pain, unspecified: Secondary | ICD-10-CM | POA: Diagnosis not present

## 2023-11-17 DIAGNOSIS — M9904 Segmental and somatic dysfunction of sacral region: Secondary | ICD-10-CM | POA: Diagnosis not present

## 2023-11-17 DIAGNOSIS — M9903 Segmental and somatic dysfunction of lumbar region: Secondary | ICD-10-CM | POA: Diagnosis not present

## 2023-12-01 DIAGNOSIS — M9901 Segmental and somatic dysfunction of cervical region: Secondary | ICD-10-CM | POA: Diagnosis not present

## 2023-12-01 DIAGNOSIS — M545 Low back pain, unspecified: Secondary | ICD-10-CM | POA: Diagnosis not present

## 2023-12-01 DIAGNOSIS — M9903 Segmental and somatic dysfunction of lumbar region: Secondary | ICD-10-CM | POA: Diagnosis not present

## 2023-12-01 DIAGNOSIS — M9904 Segmental and somatic dysfunction of sacral region: Secondary | ICD-10-CM | POA: Diagnosis not present

## 2023-12-15 DIAGNOSIS — M545 Low back pain, unspecified: Secondary | ICD-10-CM | POA: Diagnosis not present

## 2023-12-15 DIAGNOSIS — M9903 Segmental and somatic dysfunction of lumbar region: Secondary | ICD-10-CM | POA: Diagnosis not present

## 2023-12-15 DIAGNOSIS — M9904 Segmental and somatic dysfunction of sacral region: Secondary | ICD-10-CM | POA: Diagnosis not present

## 2023-12-15 DIAGNOSIS — M9901 Segmental and somatic dysfunction of cervical region: Secondary | ICD-10-CM | POA: Diagnosis not present

## 2024-01-10 DIAGNOSIS — Z961 Presence of intraocular lens: Secondary | ICD-10-CM | POA: Diagnosis not present

## 2024-01-10 DIAGNOSIS — H26491 Other secondary cataract, right eye: Secondary | ICD-10-CM | POA: Diagnosis not present

## 2024-01-10 DIAGNOSIS — H35371 Puckering of macula, right eye: Secondary | ICD-10-CM | POA: Diagnosis not present

## 2024-01-10 DIAGNOSIS — H43813 Vitreous degeneration, bilateral: Secondary | ICD-10-CM | POA: Diagnosis not present

## 2024-01-12 DIAGNOSIS — M9904 Segmental and somatic dysfunction of sacral region: Secondary | ICD-10-CM | POA: Diagnosis not present

## 2024-01-12 DIAGNOSIS — M545 Low back pain, unspecified: Secondary | ICD-10-CM | POA: Diagnosis not present

## 2024-01-12 DIAGNOSIS — M9901 Segmental and somatic dysfunction of cervical region: Secondary | ICD-10-CM | POA: Diagnosis not present

## 2024-01-26 DIAGNOSIS — M9904 Segmental and somatic dysfunction of sacral region: Secondary | ICD-10-CM | POA: Diagnosis not present

## 2024-01-26 DIAGNOSIS — M9901 Segmental and somatic dysfunction of cervical region: Secondary | ICD-10-CM | POA: Diagnosis not present

## 2024-01-26 DIAGNOSIS — M9903 Segmental and somatic dysfunction of lumbar region: Secondary | ICD-10-CM | POA: Diagnosis not present

## 2024-01-26 DIAGNOSIS — M545 Low back pain, unspecified: Secondary | ICD-10-CM | POA: Diagnosis not present

## 2024-02-09 ENCOUNTER — Telehealth: Payer: Self-pay | Admitting: *Deleted

## 2024-02-09 DIAGNOSIS — M9903 Segmental and somatic dysfunction of lumbar region: Secondary | ICD-10-CM | POA: Diagnosis not present

## 2024-02-09 DIAGNOSIS — M9901 Segmental and somatic dysfunction of cervical region: Secondary | ICD-10-CM | POA: Diagnosis not present

## 2024-02-09 DIAGNOSIS — M545 Low back pain, unspecified: Secondary | ICD-10-CM | POA: Diagnosis not present

## 2024-02-09 DIAGNOSIS — M9904 Segmental and somatic dysfunction of sacral region: Secondary | ICD-10-CM | POA: Diagnosis not present

## 2024-02-09 NOTE — Telephone Encounter (Signed)
 Copied from CRM #[id number]. Topic: Clinical - Request for Lab/Test Order >> Feb 09, 2024  1:43 PM Berneda FALCON wrote: Reason for CRM: Patient is requesting lab orders for physical

## 2024-02-09 NOTE — Telephone Encounter (Signed)
 Please schedule fasting labs prior to CPE, PCP will place orders closer to appt date

## 2024-02-23 DIAGNOSIS — M9904 Segmental and somatic dysfunction of sacral region: Secondary | ICD-10-CM | POA: Diagnosis not present

## 2024-02-23 DIAGNOSIS — M9901 Segmental and somatic dysfunction of cervical region: Secondary | ICD-10-CM | POA: Diagnosis not present

## 2024-02-23 DIAGNOSIS — M9903 Segmental and somatic dysfunction of lumbar region: Secondary | ICD-10-CM | POA: Diagnosis not present

## 2024-02-23 DIAGNOSIS — M545 Low back pain, unspecified: Secondary | ICD-10-CM | POA: Diagnosis not present

## 2024-03-04 ENCOUNTER — Other Ambulatory Visit: Payer: Self-pay | Admitting: Family Medicine

## 2024-03-04 DIAGNOSIS — I10 Essential (primary) hypertension: Secondary | ICD-10-CM

## 2024-03-04 DIAGNOSIS — Z125 Encounter for screening for malignant neoplasm of prostate: Secondary | ICD-10-CM

## 2024-03-04 DIAGNOSIS — M109 Gout, unspecified: Secondary | ICD-10-CM

## 2024-03-08 DIAGNOSIS — M9904 Segmental and somatic dysfunction of sacral region: Secondary | ICD-10-CM | POA: Diagnosis not present

## 2024-03-08 DIAGNOSIS — M9901 Segmental and somatic dysfunction of cervical region: Secondary | ICD-10-CM | POA: Diagnosis not present

## 2024-03-08 DIAGNOSIS — M545 Low back pain, unspecified: Secondary | ICD-10-CM | POA: Diagnosis not present

## 2024-03-08 DIAGNOSIS — M9903 Segmental and somatic dysfunction of lumbar region: Secondary | ICD-10-CM | POA: Diagnosis not present

## 2024-03-12 ENCOUNTER — Encounter: Admitting: Family Medicine

## 2024-03-19 ENCOUNTER — Other Ambulatory Visit

## 2024-03-19 DIAGNOSIS — I10 Essential (primary) hypertension: Secondary | ICD-10-CM | POA: Diagnosis not present

## 2024-03-19 DIAGNOSIS — M109 Gout, unspecified: Secondary | ICD-10-CM

## 2024-03-19 DIAGNOSIS — Z125 Encounter for screening for malignant neoplasm of prostate: Secondary | ICD-10-CM | POA: Diagnosis not present

## 2024-03-19 LAB — COMPREHENSIVE METABOLIC PANEL WITH GFR
ALT: 12 U/L (ref 0–53)
AST: 16 U/L (ref 0–37)
Albumin: 4.2 g/dL (ref 3.5–5.2)
Alkaline Phosphatase: 49 U/L (ref 39–117)
BUN: 17 mg/dL (ref 6–23)
CO2: 31 meq/L (ref 19–32)
Calcium: 9.4 mg/dL (ref 8.4–10.5)
Chloride: 103 meq/L (ref 96–112)
Creatinine, Ser: 1.11 mg/dL (ref 0.40–1.50)
GFR: 63.6 mL/min (ref >60.00)
Glucose, Bld: 96 mg/dL (ref 70–99)
Potassium: 4.4 meq/L (ref 3.5–5.1)
Sodium: 139 meq/L (ref 135–145)
Total Bilirubin: 0.5 mg/dL (ref 0.2–1.2)
Total Protein: 6.3 g/dL (ref 6.0–8.3)

## 2024-03-19 LAB — LIPID PANEL
Cholesterol: 177 mg/dL (ref 0–200)
HDL: 42.9 mg/dL (ref >39.00)
LDL Cholesterol: 106 mg/dL — ABNORMAL HIGH (ref 0–99)
NonHDL: 134
Total CHOL/HDL Ratio: 4
Triglycerides: 138 mg/dL (ref 0.0–149.0)
VLDL: 27.6 mg/dL (ref 0.0–40.0)

## 2024-03-19 LAB — CBC WITH DIFFERENTIAL/PLATELET
Basophils Absolute: 0.1 K/uL (ref 0.0–0.1)
Basophils Relative: 1 % (ref 0.0–3.0)
Eosinophils Absolute: 0.3 K/uL (ref 0.0–0.7)
Eosinophils Relative: 3.8 % (ref 0.0–5.0)
HCT: 44 % (ref 39.0–52.0)
Hemoglobin: 14.9 g/dL (ref 13.0–17.0)
Lymphocytes Relative: 29.4 % (ref 12.0–46.0)
Lymphs Abs: 2 K/uL (ref 0.7–4.0)
MCHC: 33.9 g/dL (ref 30.0–36.0)
MCV: 91.2 fl (ref 78.0–100.0)
Monocytes Absolute: 0.9 K/uL (ref 0.1–1.0)
Monocytes Relative: 13 % — ABNORMAL HIGH (ref 3.0–12.0)
Neutro Abs: 3.6 K/uL (ref 1.4–7.7)
Neutrophils Relative %: 52.8 % (ref 43.0–77.0)
Platelets: 239 K/uL (ref 150.0–400.0)
RBC: 4.82 Mil/uL (ref 4.22–5.81)
RDW: 13 % (ref 11.5–15.5)
WBC: 6.9 K/uL (ref 4.0–10.5)

## 2024-03-19 LAB — TSH: TSH: 1.84 u[IU]/mL (ref 0.35–5.50)

## 2024-03-19 LAB — PSA, MEDICARE: PSA: 0.59 ng/mL (ref 0.10–4.00)

## 2024-03-19 LAB — URIC ACID: Uric Acid, Serum: 6.9 mg/dL (ref 4.0–7.8)

## 2024-03-21 ENCOUNTER — Ambulatory Visit: Payer: Self-pay | Admitting: Family Medicine

## 2024-03-26 ENCOUNTER — Encounter: Admitting: Family Medicine

## 2024-04-11 ENCOUNTER — Other Ambulatory Visit: Payer: Self-pay | Admitting: Family Medicine

## 2024-04-11 DIAGNOSIS — I10 Essential (primary) hypertension: Secondary | ICD-10-CM

## 2024-04-11 DIAGNOSIS — M109 Gout, unspecified: Secondary | ICD-10-CM

## 2024-04-19 ENCOUNTER — Encounter: Payer: Self-pay | Admitting: Family Medicine

## 2024-04-19 ENCOUNTER — Ambulatory Visit (INDEPENDENT_AMBULATORY_CARE_PROVIDER_SITE_OTHER): Admitting: Family Medicine

## 2024-04-19 ENCOUNTER — Telehealth: Payer: Self-pay

## 2024-04-19 ENCOUNTER — Other Ambulatory Visit (HOSPITAL_COMMUNITY): Payer: Self-pay

## 2024-04-19 VITALS — BP 118/80 | HR 58 | Temp 97.9°F | Ht 70.0 in | Wt 223.4 lb

## 2024-04-19 DIAGNOSIS — I10 Essential (primary) hypertension: Secondary | ICD-10-CM | POA: Diagnosis not present

## 2024-04-19 DIAGNOSIS — G4733 Obstructive sleep apnea (adult) (pediatric): Secondary | ICD-10-CM

## 2024-04-19 DIAGNOSIS — N529 Male erectile dysfunction, unspecified: Secondary | ICD-10-CM

## 2024-04-19 DIAGNOSIS — R202 Paresthesia of skin: Secondary | ICD-10-CM | POA: Diagnosis not present

## 2024-04-19 DIAGNOSIS — Z7189 Other specified counseling: Secondary | ICD-10-CM

## 2024-04-19 DIAGNOSIS — Z0000 Encounter for general adult medical examination without abnormal findings: Secondary | ICD-10-CM

## 2024-04-19 DIAGNOSIS — M109 Gout, unspecified: Secondary | ICD-10-CM | POA: Diagnosis not present

## 2024-04-19 DIAGNOSIS — Z1211 Encounter for screening for malignant neoplasm of colon: Secondary | ICD-10-CM

## 2024-04-19 LAB — VITAMIN B12: Vitamin B-12: 785 pg/mL (ref 211–911)

## 2024-04-19 MED ORDER — ALLOPURINOL 100 MG PO TABS: 100.0000 mg | ORAL_TABLET | Freq: Every day | ORAL | 3 refills | Status: AC

## 2024-04-19 MED ORDER — LOSARTAN POTASSIUM 25 MG PO TABS: 25.0000 mg | ORAL_TABLET | Freq: Every day | ORAL | 3 refills | Status: AC

## 2024-04-19 MED ORDER — COLCHICINE 0.6 MG PO TABS: ORAL_TABLET | ORAL | 3 refills | Status: AC

## 2024-04-19 MED ORDER — SILDENAFIL CITRATE 20 MG PO TABS: 20.0000 mg | ORAL_TABLET | Freq: Every day | ORAL | 12 refills | Status: AC | PRN

## 2024-04-19 NOTE — Patient Instructions (Addendum)
 Ask the pharmacy about Tdap and RSV.  Take care.  Glad to see you. Go to the lab on the way out.   If you have mychart we'll likely use that to update you.

## 2024-04-19 NOTE — Telephone Encounter (Signed)
 Pharmacy Patient Advocate Encounter   Received notification from [facility] that prior authorization for Sildenafil  citrate 20 tabs is required/requested.   Insurance verification completed.   The patient is insured through [facility].   Per test claim: PA required; PA submitted to above mentioned insurance via Latent Key/confirmation #/EOC [id number] Status is pending

## 2024-04-19 NOTE — Telephone Encounter (Signed)
 Notify patient that insurance will not cover this.  Thanks.

## 2024-04-19 NOTE — Telephone Encounter (Signed)
 Pharmacy Patient Advocate Encounter  Received notification from [facility] that Prior Authorization for Sildenafil  citrate 20 tabs has been DENIED.  Full denial letter will be uploaded to the media tab. See denial reason below.    PA #/Case ID/Reference #: # [id number]

## 2024-04-19 NOTE — Progress Notes (Signed)
 Flu 2025 Shingles previously done at [facility] per patient report PNA previously done Tetanus 2013 COVID-vaccine previously done. RSV vaccine discussed with patient. Cologuard ordered 2026.  D/w pt about options.  Would proceed given his overall good functional status.   Prostate cancer screening 2025 Advance directive-wife designated if patient were incapacitated  Gout. Allopurinol  daily, rare prn of colchicine .  Doing well with that.  No flares in the last few years.     OSA treated with mouthguard and compliant.  Sleeping well with that.     Hypertension:               Using medication without problems or lightheadedness: yes Chest pain with exertion:no Edema:no Short of breath:no   ED.  Sildenafil  worked, no ADE on med.  Used as needed.  Altered but not absent sensation/tingling in the distal feet.  Back pain is better.  Symmetric, on the soles of the feet.  Going on for 2-3 years.  Had been taking B12 at baseline and that seemed to help with baseline energy level.  No hand sx. see notes on labs.  Meds, vitals, and allergies reviewed.   ROS: Per HPI unless specifically indicated in ROS section   GEN: nad, alert and oriented HEENT: mucous membranes moist NECK: supple w/o LA CV: rrr PULM: ctab, no inc wob ABD: soft, +bs EXT: no edema SKIN: no acute rash  Normal inspection, monofilament/light touch, cap refill and DP pulses B on the feet.  B 1st MT calluses w/o ulceration.

## 2024-04-20 NOTE — Telephone Encounter (Signed)
 Mychart sent to patient.

## 2024-04-22 ENCOUNTER — Ambulatory Visit: Payer: Self-pay | Admitting: Family Medicine

## 2024-04-22 DIAGNOSIS — R202 Paresthesia of skin: Secondary | ICD-10-CM | POA: Insufficient documentation

## 2024-04-22 NOTE — Assessment & Plan Note (Signed)
Unclear source.  See notes on labs. 

## 2024-04-22 NOTE — Assessment & Plan Note (Signed)
 Flu 2025 Shingles previously done at [facility] per patient report PNA previously done Tetanus 2013 COVID-vaccine previously done. RSV vaccine discussed with patient. Cologuard ordered 2026.  D/w pt about options.  Would proceed given his overall good functional status.   Prostate cancer screening 2025 Advance directive-wife designated if patient were incapacitated

## 2024-04-22 NOTE — Assessment & Plan Note (Signed)
 OSA treated with mouthguard and compliant.  Sleeping well with that.   Continue as is.

## 2024-04-22 NOTE — Assessment & Plan Note (Signed)
"   Sildenafil  worked, no ADE on med.   Continue as is. "

## 2024-04-22 NOTE — Assessment & Plan Note (Signed)
 Allopurinol  daily, rare prn of colchicine .  Doing well with that.  No flares in the last few years.   Continue as is.

## 2024-04-22 NOTE — Assessment & Plan Note (Signed)
 Advance directive- wife designated if patient were incapacitated.

## 2024-04-22 NOTE — Assessment & Plan Note (Signed)
Continue work on diet and exercise.  Continue losartan.

## 2024-05-02 LAB — COLOGUARD: COLOGUARD: NEGATIVE
